# Patient Record
Sex: Female | Born: 1950 | Race: White | Hispanic: No | Marital: Married | Smoking: Never smoker
Health system: Southern US, Community
[De-identification: ages and names within clinical notes are randomized; demographics above are authoritative.]

## PROBLEM LIST (undated history)

## (undated) DIAGNOSIS — K219 Gastro-esophageal reflux disease without esophagitis: Secondary | ICD-10-CM

## (undated) DIAGNOSIS — K635 Polyp of colon: Secondary | ICD-10-CM

## (undated) DIAGNOSIS — E079 Disorder of thyroid, unspecified: Secondary | ICD-10-CM

## (undated) HISTORY — DX: Disorder of thyroid, unspecified: E07.9

## (undated) HISTORY — PX: THYROID SURGERY: SHX805

## (undated) HISTORY — DX: Polyp of colon: K63.5

## (undated) HISTORY — PX: WISDOM TOOTH EXTRACTION: SHX21

## (undated) HISTORY — DX: Gastro-esophageal reflux disease without esophagitis: K21.9

## (undated) HISTORY — PX: COLONOSCOPY: SHX5424

---

## 1998-01-04 ENCOUNTER — Encounter: Payer: Self-pay | Admitting: Emergency Medicine

## 1998-01-04 ENCOUNTER — Emergency Department (HOSPITAL_COMMUNITY): Admission: EM | Admit: 1998-01-04 | Discharge: 1998-01-04 | Payer: Self-pay | Admitting: Emergency Medicine

## 2003-11-18 ENCOUNTER — Ambulatory Visit (HOSPITAL_COMMUNITY): Admission: RE | Admit: 2003-11-18 | Discharge: 2003-11-18 | Payer: Self-pay | Admitting: Family Medicine

## 2005-07-24 ENCOUNTER — Ambulatory Visit: Payer: Self-pay | Admitting: Internal Medicine

## 2005-08-09 ENCOUNTER — Ambulatory Visit: Payer: Self-pay | Admitting: Internal Medicine

## 2010-04-26 ENCOUNTER — Other Ambulatory Visit: Payer: Self-pay | Admitting: Dermatology

## 2010-09-08 ENCOUNTER — Encounter: Payer: Self-pay | Admitting: Internal Medicine

## 2010-12-30 ENCOUNTER — Emergency Department (HOSPITAL_COMMUNITY): Payer: BC Managed Care – PPO

## 2010-12-30 ENCOUNTER — Emergency Department (HOSPITAL_COMMUNITY)
Admission: EM | Admit: 2010-12-30 | Discharge: 2010-12-30 | Disposition: A | Discharge status: Home / Self Care | Payer: BC Managed Care – PPO | Attending: Emergency Medicine | Admitting: Emergency Medicine

## 2010-12-30 ENCOUNTER — Ambulatory Visit (INDEPENDENT_AMBULATORY_CARE_PROVIDER_SITE_OTHER): Payer: BC Managed Care – PPO

## 2010-12-30 ENCOUNTER — Inpatient Hospital Stay (INDEPENDENT_AMBULATORY_CARE_PROVIDER_SITE_OTHER)
Admission: RE | Admit: 2010-12-30 | Discharge: 2010-12-30 | Disposition: A | Discharge status: Home / Self Care | Payer: BC Managed Care – PPO | Source: Ambulatory Visit | Attending: Emergency Medicine | Admitting: Emergency Medicine

## 2010-12-30 DIAGNOSIS — S20219A Contusion of unspecified front wall of thorax, initial encounter: Secondary | ICD-10-CM

## 2010-12-30 DIAGNOSIS — E039 Hypothyroidism, unspecified: Secondary | ICD-10-CM | POA: Insufficient documentation

## 2010-12-30 DIAGNOSIS — Y93E1 Activity, personal bathing and showering: Secondary | ICD-10-CM | POA: Insufficient documentation

## 2010-12-30 DIAGNOSIS — Y92009 Unspecified place in unspecified non-institutional (private) residence as the place of occurrence of the external cause: Secondary | ICD-10-CM | POA: Insufficient documentation

## 2010-12-30 DIAGNOSIS — Z79899 Other long term (current) drug therapy: Secondary | ICD-10-CM | POA: Insufficient documentation

## 2010-12-30 DIAGNOSIS — R071 Chest pain on breathing: Secondary | ICD-10-CM | POA: Insufficient documentation

## 2010-12-30 DIAGNOSIS — R1011 Right upper quadrant pain: Secondary | ICD-10-CM | POA: Insufficient documentation

## 2010-12-30 DIAGNOSIS — W010XXA Fall on same level from slipping, tripping and stumbling without subsequent striking against object, initial encounter: Secondary | ICD-10-CM | POA: Insufficient documentation

## 2010-12-30 LAB — POCT I-STAT, CHEM 8
Chloride: 106 mEq/L (ref 96–112)
HCT: 44 % (ref 36.0–46.0)
Potassium: 4.1 mEq/L (ref 3.5–5.1)
Sodium: 142 mEq/L (ref 135–145)

## 2010-12-30 LAB — POCT I-STAT TROPONIN I: Troponin i, poc: 0 ng/mL (ref 0.00–0.08)

## 2010-12-30 MED ORDER — IOHEXOL 300 MG/ML  SOLN
100.0000 mL | Freq: Once | INTRAMUSCULAR | Status: AC | PRN
  Administered 2010-12-30: 100 mL via INTRAVENOUS

## 2011-12-26 ENCOUNTER — Encounter: Payer: Self-pay | Admitting: Internal Medicine

## 2013-12-27 ENCOUNTER — Other Ambulatory Visit: Payer: Self-pay | Admitting: Family Medicine

## 2013-12-27 DIAGNOSIS — R142 Eructation: Secondary | ICD-10-CM

## 2013-12-27 DIAGNOSIS — K30 Functional dyspepsia: Secondary | ICD-10-CM

## 2013-12-27 DIAGNOSIS — R1011 Right upper quadrant pain: Secondary | ICD-10-CM

## 2014-01-04 ENCOUNTER — Other Ambulatory Visit: Payer: Self-pay | Admitting: Family Medicine

## 2014-01-04 ENCOUNTER — Ambulatory Visit
Admission: RE | Admit: 2014-01-04 | Discharge: 2014-01-04 | Disposition: A | Discharge status: Home / Self Care | Payer: 59 | Source: Ambulatory Visit | Attending: Family Medicine | Admitting: Family Medicine

## 2014-01-04 DIAGNOSIS — R142 Eructation: Secondary | ICD-10-CM

## 2014-01-04 DIAGNOSIS — K30 Functional dyspepsia: Secondary | ICD-10-CM

## 2014-01-04 DIAGNOSIS — R1011 Right upper quadrant pain: Secondary | ICD-10-CM

## 2014-09-15 ENCOUNTER — Encounter: Payer: Self-pay | Admitting: Internal Medicine

## 2014-11-28 ENCOUNTER — Ambulatory Visit (INDEPENDENT_AMBULATORY_CARE_PROVIDER_SITE_OTHER): Payer: BLUE CROSS/BLUE SHIELD | Admitting: Internal Medicine

## 2014-11-28 ENCOUNTER — Encounter: Payer: Self-pay | Admitting: Internal Medicine

## 2014-11-28 VITALS — BP 132/80 | HR 68 | Ht 65.75 in | Wt 184.1 lb

## 2014-11-28 DIAGNOSIS — R142 Eructation: Secondary | ICD-10-CM | POA: Diagnosis not present

## 2014-11-28 DIAGNOSIS — Z86010 Personal history of colonic polyps: Secondary | ICD-10-CM | POA: Diagnosis not present

## 2014-11-28 DIAGNOSIS — K219 Gastro-esophageal reflux disease without esophagitis: Secondary | ICD-10-CM

## 2014-11-28 DIAGNOSIS — R1314 Dysphagia, pharyngoesophageal phase: Secondary | ICD-10-CM

## 2014-11-28 NOTE — Progress Notes (Signed)
HISTORY OF PRESENT ILLNESS:  Kelsey Romero is a 64 y.o. female who is referred today by her primary care provider Dr. Aida Puffer regarding a chief complaint of chronic dyspepsia and the need for surveillance colonoscopy. The patient was last seen 08/09/2005 for screening colonoscopy. She was found to have a 5 mm sigmoid colon polyp which was removed and found to be adenomatous. Also mild sigmoid diverticulosis. Follow-up in 5 years recommended. She did receive a recall letter but did not act. The patient reports to me problems with burping or belching for the past 10-12 years. This is problematic about once every 6 weeks and occasionally associated with mid scapular discomfort. She will use PPI or acid on demand. This seems to help. She denies classic reflux symptoms such as pyrosis or water brash. She does notice rare solid food dysphagia item such as french fries. No lower GI complaints. Her weight has been stable. No family history of gastrointestinal malignancy.  REVIEW OF SYSTEMS:  All non-GI ROS negative upon comprehensive review of all systems  Past Medical History  Diagnosis Date  . Thyroid disease   . GERD (gastroesophageal reflux disease)     Past Surgical History  Procedure Laterality Date  . Colonoscopy    . Thyroid surgery      radiation  . Wisdom tooth extraction      Social History Kelsey Romero  reports that she has never smoked. She has never used smokeless tobacco. She reports that she drinks alcohol. She reports that she does not use illicit drugs.  family history includes Breast cancer in her mother.  No Known Allergies     PHYSICAL EXAMINATION: Vital signs: BP 132/80 mmHg  Pulse 68  Ht 5' 5.75" (1.67 m)  Wt 184 lb 2 oz (83.519 kg)  BMI 29.95 kg/m2  Constitutional: generally well-appearing, no acute distress Psychiatric: alert and oriented x3, cooperative Eyes: extraocular movements intact, anicteric, conjunctiva pink Mouth: oral pharynx moist, no  lesions Neck: supple no lymphadenopathy Cardiovascular: heart regular rate and rhythm, no murmur Lungs: clear to auscultation bilaterally Abdomen: soft, nontender, nondistended, no obvious ascites, no peritoneal signs, normal bowel sounds, no organomegaly Rectal: Deferred until colonoscopy Extremities: no clubbing cyanosis or lower extremity edema bilaterally Skin: no lesions on visible extremities Neuro: No focal deficits. No asterixis.  ASSESSMENT:  #1. Chronic belching. No alarm features #2. Mild intermittent dysphagia. Rule out stricture #3. History of adenomatous colon polyp May 2007. Overdue for follow-up  PLAN:  #1. Discussed increased intestinal gas and belching. Reassurance #2. Upper endoscopy to evaluate intermittent dysphagia and dyspeptic symptoms.The nature of the procedure, as well as the risks, benefits, and alternatives were carefully and thoroughly reviewed with the patient. Ample time for discussion and questions allowed. The patient understood, was satisfied, and agreed to proceed. #3. Surveillance colonoscopy.The nature of the procedure, as well as the risks, benefits, and alternatives were carefully and thoroughly reviewed with the patient. Ample time for discussion and questions allowed. The patient understood, was satisfied, and agreed to proceed.  A copy of this consultation note has been sent to Dr. Aida Puffer

## 2014-11-28 NOTE — Patient Instructions (Signed)

## 2014-11-29 ENCOUNTER — Encounter: Payer: Self-pay | Admitting: Internal Medicine

## 2015-02-01 ENCOUNTER — Ambulatory Visit (AMBULATORY_SURGERY_CENTER): Payer: BLUE CROSS/BLUE SHIELD | Admitting: Internal Medicine

## 2015-02-01 ENCOUNTER — Encounter: Payer: Self-pay | Admitting: Internal Medicine

## 2015-02-01 VITALS — BP 118/50 | HR 66 | Temp 97.2°F | Resp 20 | Ht 65.0 in | Wt 184.0 lb

## 2015-02-01 DIAGNOSIS — K2100 Gastro-esophageal reflux disease with esophagitis, without bleeding: Secondary | ICD-10-CM

## 2015-02-01 DIAGNOSIS — Z86010 Personal history of colonic polyps: Secondary | ICD-10-CM | POA: Diagnosis not present

## 2015-02-01 DIAGNOSIS — R1319 Other dysphagia: Secondary | ICD-10-CM

## 2015-02-01 DIAGNOSIS — R1314 Dysphagia, pharyngoesophageal phase: Secondary | ICD-10-CM | POA: Diagnosis present

## 2015-02-01 DIAGNOSIS — R1310 Dysphagia, unspecified: Secondary | ICD-10-CM

## 2015-02-01 DIAGNOSIS — K219 Gastro-esophageal reflux disease without esophagitis: Secondary | ICD-10-CM

## 2015-02-01 DIAGNOSIS — K208 Other esophagitis: Secondary | ICD-10-CM | POA: Diagnosis not present

## 2015-02-01 MED ORDER — SODIUM CHLORIDE 0.9 % IV SOLN: 500.0000 mL | INTRAVENOUS | Status: DC

## 2015-02-01 MED ORDER — OMEPRAZOLE 40 MG PO CPDR: 40.0000 mg | DELAYED_RELEASE_CAPSULE | Freq: Every day | ORAL | Status: DC

## 2015-02-01 NOTE — Progress Notes (Signed)
Called to room to assist during endoscopic procedure.  Patient ID and intended procedure confirmed with present staff. Received instructions for my participation in the procedure from the performing physician.  

## 2015-02-01 NOTE — Op Note (Signed)
[facility] [address] [place], [id number]   COLONOSCOPY PROCEDURE REPORT  PATIENT: Kelsey Romero, Kelsey Romero  MR#: [id number] BIRTHDATE: March 26, 1951 , 64  yrs. old GENDER: female ENDOSCOPIST: Roxy CedarJohn N Perry Jr, MD REFERRED XB:JYNWGBY:James Little, M.D. PROCEDURE DATE:  02/01/2015 PROCEDURE:   Colonoscopy, surveillance First Screening Colonoscopy - Avg.  risk and is 50 yrs.  old or older - No.  Prior Negative Screening - Now for repeat screening. N/A  History of Adenoma - Now for follow-up colonoscopy & has been > or = to 3 yrs.  Yes hx of adenoma.  Has been 3 or more years since last colonoscopy.  Polyps removed today? No Recommend repeat exam, <10 yrs? No ASA CLASS:   Class II INDICATIONS:Surveillance due to prior colonic neoplasia and PH Colon Adenoma. index examination 2007 with small tubular adenoma MEDICATIONS: Monitored anesthesia care and Propofol 300 mg IV  DESCRIPTION OF PROCEDURE:   After the risks benefits and alternatives of the procedure were thoroughly explained, informed consent was obtained.  The digital rectal exam revealed no abnormalities of the rectum.   The LB [id number]  endoscope was introduced through the anus and advanced to the cecum, which was identified by both the appendix and ileocecal valve. No adverse events experienced.   The quality of the prep was excellent. (Suprep was used)  The instrument was then slowly withdrawn as the colon was fully examined. Estimated blood loss is zero unless otherwise noted in this procedure report.     COLON FINDINGS: A normal appearing cecum, ileocecal valve, and appendiceal orifice were identified.  The ascending, transverse, descending, sigmoid colon, and rectum appeared unremarkable. Retroflexed views revealed no abnormalities. The time to cecum = 3.6 Withdrawal time = 8.5   The scope was withdrawn and the procedure completed. COMPLICATIONS: There were no immediate complications.  ENDOSCOPIC  IMPRESSION: 1. Normal colonoscopy  RECOMMENDATIONS: 1. Continue current colorectal screening recommendations for "routine risk" patients with a repeat colonoscopy in 10 years. 2. EGD today (please see report)  eSigned:  Roxy CedarJohn N Perry Jr, MD 02/01/2015 8:59 AM   cc: The Patient and Aida PufferJames Little, MD

## 2015-02-01 NOTE — Op Note (Signed)
[facility] [address] [place], [id number]   ENDOSCOPY PROCEDURE REPORT  PATIENT: Kelsey RoesFields, [name]  MR#: [id number] BIRTHDATE: 10/10/1950 , 64  yrs. old GENDER: female ENDOSCOPIST: Roxy CedarJohn N Michalle Rademaker Jr, MD REFERRED BY:  Aida PufferJames Little, M.D. PROCEDURE DATE:  02/01/2015 PROCEDURE:  EGD w/ biopsy ASA CLASS:     Class II INDICATIONS:  history of esophageal reflux and dysphagia. MEDICATIONS: Monitored anesthesia care and Propofol 150 mg IV TOPICAL ANESTHETIC: none  DESCRIPTION OF PROCEDURE: After the risks benefits and alternatives of the procedure were thoroughly explained, informed consent was obtained.  The LB [id number] endoscope was introduced through the mouth and advanced to the second portion of the duodenum , Without limitations.  The instrument was slowly withdrawn as the mucosa was fully examined.  The distal esophagus revealed esophagitis as manifested by edema and mucosal Z line.  There was a 5 mm circular area of slightly raised columnar mucosa just above the Z line.  This was biopsied.  The stomach and duodenum were normal.  Attempts to pass a 54 [place] dilator were unsuccessful (pharyngeal anatomy). Retroflexed views revealed a hiatal hernia.     The scope was then withdrawn from the patient and the procedure completed.  COMPLICATIONS: There were no immediate complications.  ENDOSCOPIC IMPRESSION: 1. GERD with endoscopic evidence of esophagitis 2. Nodular area of columnar mucosa as described. Biopsied 3. Unable to pass dilator beyond posterior pharynx (anatomy)  RECOMMENDATIONS: 1.  Anti-reflux regimen to be followed 2.  Prescribe omeprazole 40 mg daily; #30; 11 refills 3. Please contact the office  in the next few days to make a follow-up appointment with Dr. Marina GoodellPerry in about 8-10 weeks  REPEAT EXAM:  eSigned:  Roxy CedarJohn N Kamika Goodloe Jr, MD 02/01/2015 9:06 AM    CC:The Patient and Catha GosselinKevin Little, MD

## 2015-02-01 NOTE — Progress Notes (Signed)
Transferred to recovery room. A/O x3, pleased with MAC.  VSS.  Report to wendy, RN. 

## 2015-02-01 NOTE — Patient Instructions (Addendum)

## 2015-02-02 ENCOUNTER — Telehealth: Payer: Self-pay | Admitting: *Deleted

## 2015-02-02 NOTE — Telephone Encounter (Signed)
  Follow up Call-  Call back number 02/01/2015  Post procedure Call Back phone  # [phone]  Permission to leave phone message Yes     Patient questions:  Do you have a fever, pain , or abdominal swelling? No. Pain Score  0 *  Have you tolerated food without any problems? Yes.    Have you been able to return to your normal activities? Yes.    Do you have any questions about your discharge instructions: Diet   No. Medications  No. Follow up visit  No.  Do you have questions or concerns about your Care? No.  Actions: * If pain score is 4 or above: No action needed, pain <4. Pt mentioned that she had a bruise on top of left hand when got home and questioned what that would have been from. Searched documentation and unable to find anything about this. No documentation found and there was only one iv stick and that was in rt arm the patient. Pt said it was just a bruise ,did not look like any needle mark left hand. Pt also said she had what looked like a fever blister inside top lip but said she had never had a fever blister and asked if i knew what this could be from . Explained bite block used could have pinched her lip . Encouraged pt to call back if either of these places did not get better or got worse.

## 2015-02-04 ENCOUNTER — Encounter: Payer: Self-pay | Admitting: Internal Medicine

## 2015-02-21 ENCOUNTER — Telehealth: Payer: Self-pay | Admitting: Internal Medicine

## 2015-02-21 NOTE — Telephone Encounter (Signed)
Pt had ECL on 02/01/15. States prior to EGD she would have episodes here and there where she would feel something like a lump in her chest and lots of belching and burping. Pt states that since the EGD she has been having this sensation every day. Pt states she is taking omeprazole 40mg  daily. Please advise.

## 2015-02-21 NOTE — Telephone Encounter (Signed)
She's actually had some form of the symptoms for years. Endoscopy did not reveal any significant abnormalities. Recommend continuing her medication and following up in 8-10 weeks as previously recommended.

## 2015-02-21 NOTE — Telephone Encounter (Signed)
Spoke with pt and she is aware.

## 2015-03-30 ENCOUNTER — Encounter: Payer: Self-pay | Admitting: Internal Medicine

## 2015-03-30 ENCOUNTER — Ambulatory Visit (INDEPENDENT_AMBULATORY_CARE_PROVIDER_SITE_OTHER): Payer: BLUE CROSS/BLUE SHIELD | Admitting: Internal Medicine

## 2015-03-30 VITALS — BP 124/88 | HR 68 | Ht 65.75 in | Wt 168.0 lb

## 2015-03-30 DIAGNOSIS — R1314 Dysphagia, pharyngoesophageal phase: Secondary | ICD-10-CM | POA: Diagnosis not present

## 2015-03-30 DIAGNOSIS — R142 Eructation: Secondary | ICD-10-CM

## 2015-03-30 DIAGNOSIS — K2100 Gastro-esophageal reflux disease with esophagitis, without bleeding: Secondary | ICD-10-CM

## 2015-03-30 DIAGNOSIS — Z86010 Personal history of colonic polyps: Secondary | ICD-10-CM

## 2015-03-30 DIAGNOSIS — R1310 Dysphagia, unspecified: Secondary | ICD-10-CM

## 2015-03-30 DIAGNOSIS — R1319 Other dysphagia: Secondary | ICD-10-CM

## 2015-03-30 NOTE — Patient Instructions (Signed)
Please follow up with Dr. Marina GoodellPerry in 1 year.

## 2015-03-30 NOTE — Progress Notes (Signed)
HISTORY OF PRESENT ILLNESS:  Kelsey Romero is a 64 y.o. female  who was evaluated 11/28/2014 regarding reflux disease with intermittent dysphagia, chronic belching, and surveillance colonoscopy. See that dictation for details. Chronic belching and increased intestinal gas was discussed in detail. Reassurance provided. She subsequently underwent surveillance colonoscopy which was normal. Follow-up in 10 years recommended. Upper endoscopy revealed endoscopic evidence of esophagitis as well as a nodular area of columnar mucosa which was biopsied and revealed benign squamocolumnar mucosa with chronic inflammation. No intestinal metaplasia or dysplasia. She was prescribed omeprazole 40 mg daily. She has been compliant with medication and presents today for follow-up. The patient does state post procedure she had problems with worsening belching and gas as well as associated mid chest discomfort. Those symptoms have subsequently improved. She does report definitive improvement in classic reflux symptoms. As, her dysphagia has resolved. No new complaints except for the cost of her colonoscopy.  REVIEW OF SYSTEMS:  All non-GI ROS negative upon comprehensive review  Past Medical History  Diagnosis Date  . Thyroid disease   . GERD (gastroesophageal reflux disease)   . Colon polyp     adenomatous    Past Surgical History  Procedure Laterality Date  . Colonoscopy    . Thyroid surgery      radiation  . Wisdom tooth extraction      Social History Kelsey Romero  reports that she has never smoked. She has never used smokeless tobacco. She reports that she drinks alcohol. She reports that she does not use illicit drugs.  family history includes Breast cancer in her mother.  No Known Allergies     PHYSICAL EXAMINATION: Vital signs: BP 124/88 mmHg  Pulse 68  Ht 5' 5.75" (1.67 m)  Wt 168 lb (76.204 kg)  BMI 27.32 kg/m2 General: Well-developed, well-nourished, no acute distress HEENT:  Sclerae are anicteric, conjunctiva pink. Oral mucosa intact Lungs: Clear Heart: Regular Abdomen: soft, nontender, nondistended, no obvious ascites, no peritoneal signs, normal bowel sounds. No organomegaly. Extremities: No clubbing cyanosis or edema Psychiatric: alert and oriented x3. Cooperative   ASSESSMENT:  #1. GERD with endoscopic evidence of esophagitis. Improved on PPI #2. Dysphagia. No dilation performed due to posterior pharyngeal anatomy. Fortunately, symptoms improved on PPI likely due to improvement in inflammation and edema #3. Chronic belching. Functional #4. History of adenomatous colon polyps. Most recent colonoscopy normal.   PLAN:  #1. Reflux precautions #2. Continue omeprazole 40 mg daily #3. Reassurance with regards to belching #4. Surveillance colonoscopy in 10 years #5. Routine GI follow-up one year. Resume care with PCP  15 minutes was spent face-to-face with the patient. Greater than 50% of the time used for counseling regarding chronic GERD, belching, and appropriate intervals for colonoscopy

## 2016-02-16 ENCOUNTER — Other Ambulatory Visit: Payer: Self-pay | Admitting: Internal Medicine

## 2016-02-16 DIAGNOSIS — R1310 Dysphagia, unspecified: Secondary | ICD-10-CM

## 2016-02-16 DIAGNOSIS — R1319 Other dysphagia: Secondary | ICD-10-CM

## 2016-03-07 IMAGING — US US ABDOMEN LIMITED
1 series · 14 of 25 positions shown · non-contrast
Comparison: CT abdomen pelvis 12/30/2010

CLINICAL DATA: Right upper quadrant pain

EXAM:
US ABDOMEN LIMITED - RIGHT UPPER QUADRANT

[Series 1: us abdomen limited · 0.22mm/px · 14 of 45 slices shown]
[im 1/45]
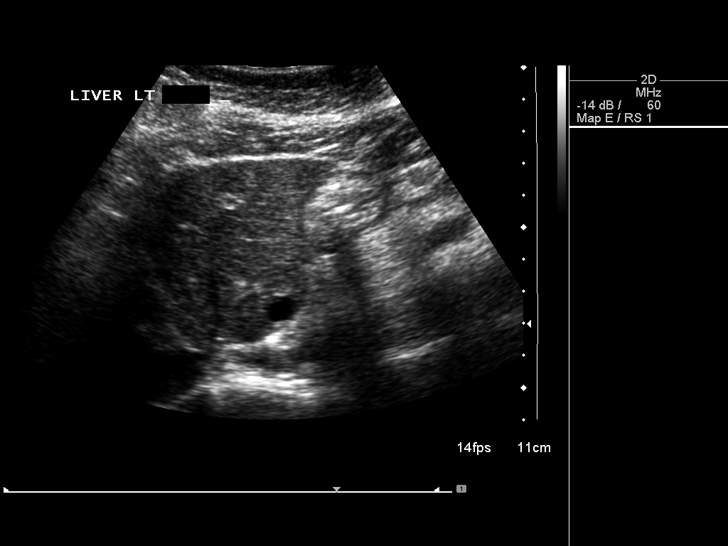
[im 4/45]
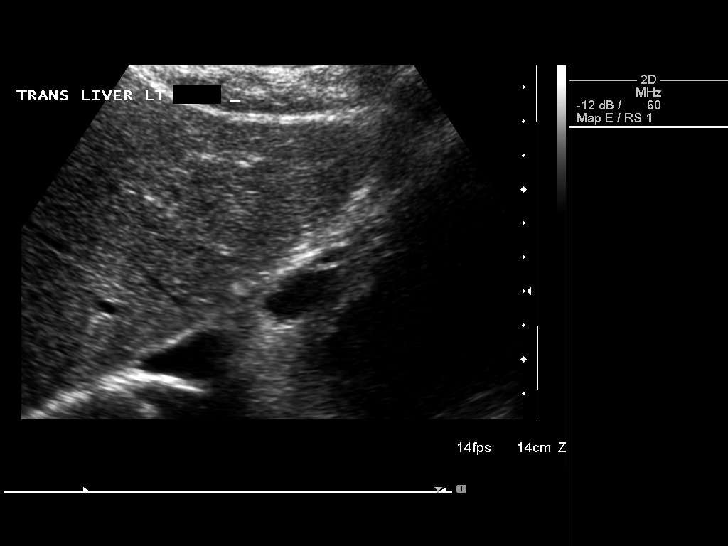
[im 8/45]
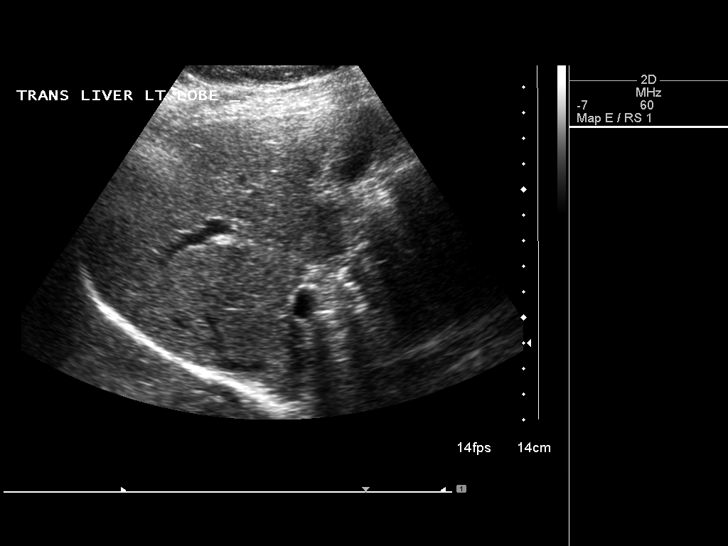
[im 12/45]
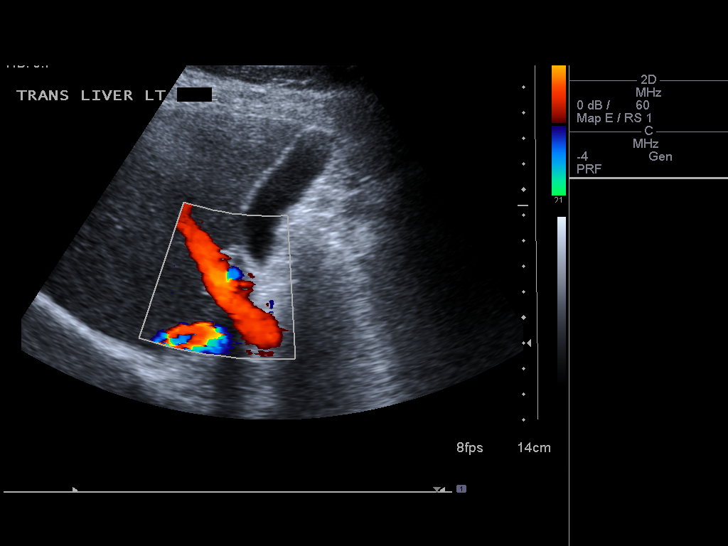
[im 15/45]
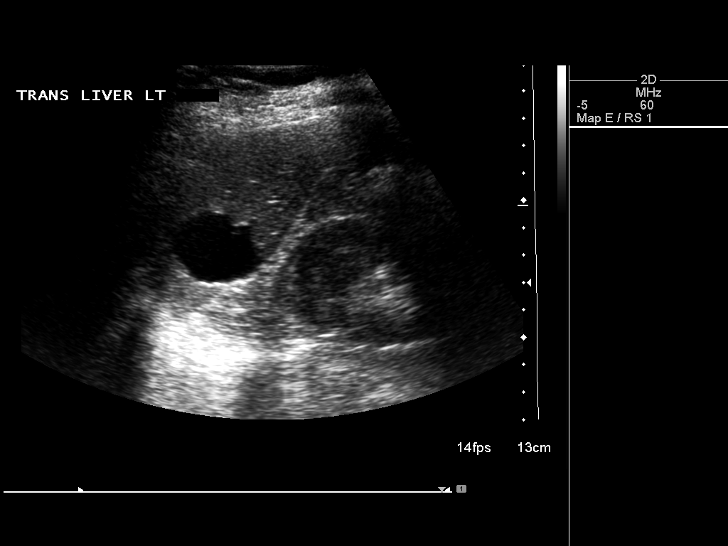
[im 17/45]
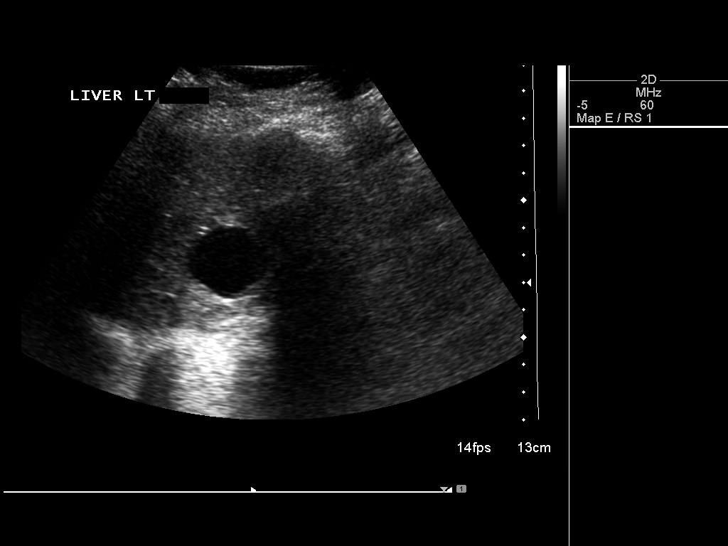
[im 21/45]
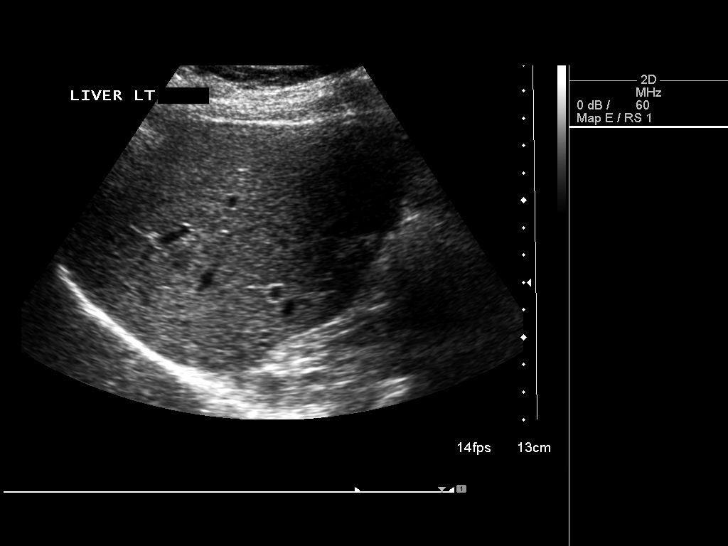
[im 24/45]
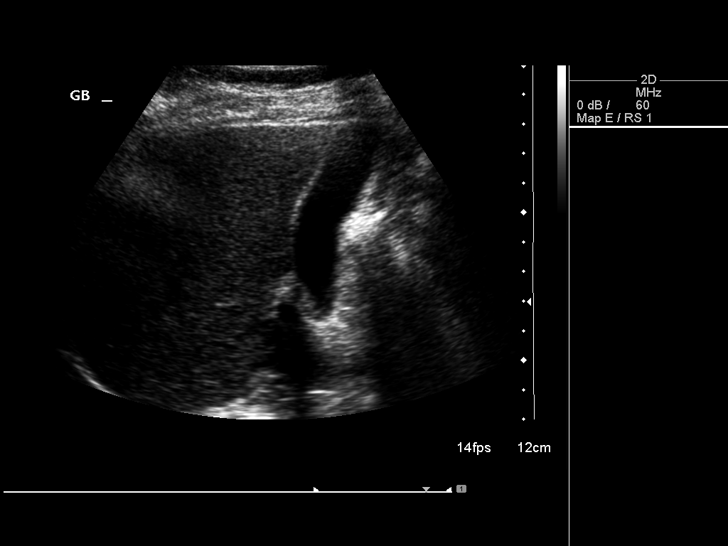
[im 28/45]
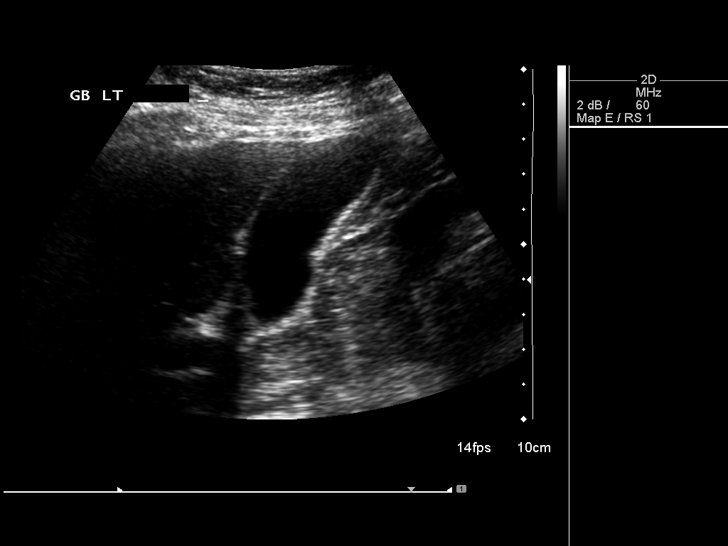
[im 30/45]
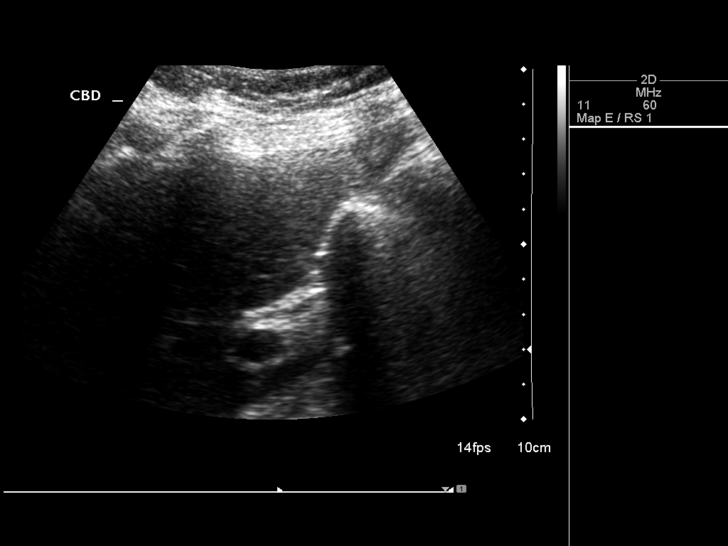
[im 34/45]
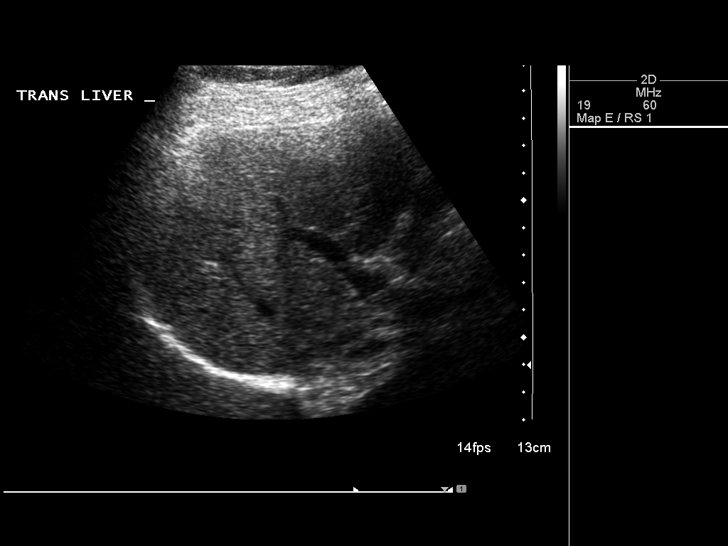
[im 37/45]
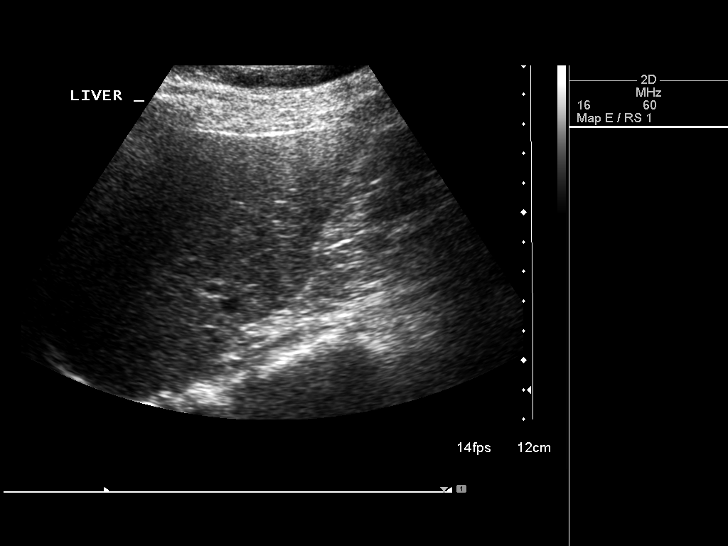
[im 41/45]
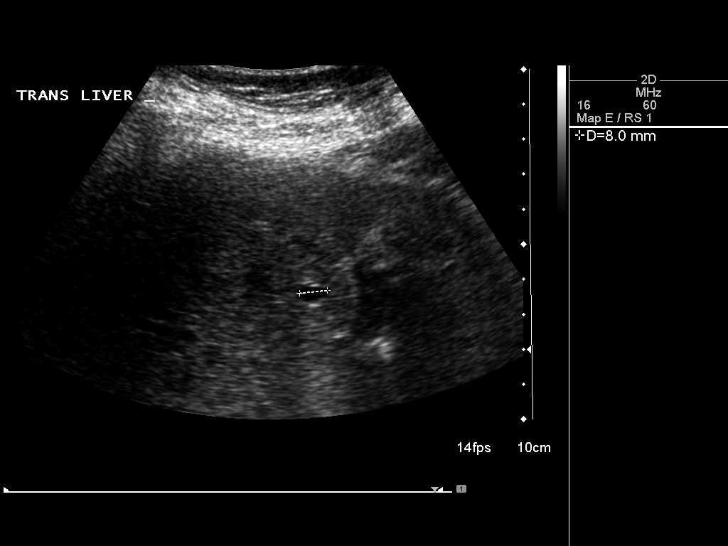
[im 45/45]
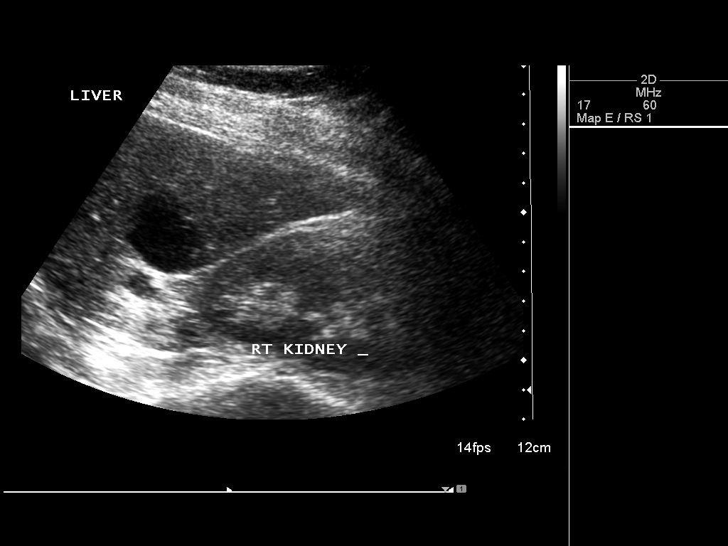

[14 of 25 positions shown; findings below may reference images not displayed]

FINDINGS: Gallbladder:

No gallstones or wall thickening visualized. No sonographic Murphy
sign noted.

Common bile duct:

Diameter: 3.1 mm

Liver:

Several liver cysts. Largest cyst 2.9 x 3.2 cm in the posterior
right lobe.
IMPRESSION: Negative for gallstones.  Hepatic cysts.

## 2016-06-17 DIAGNOSIS — E039 Hypothyroidism, unspecified: Secondary | ICD-10-CM | POA: Insufficient documentation

## 2016-06-17 DIAGNOSIS — E890 Postprocedural hypothyroidism: Secondary | ICD-10-CM | POA: Insufficient documentation

## 2016-06-17 DIAGNOSIS — R143 Flatulence: Secondary | ICD-10-CM | POA: Insufficient documentation

## 2017-09-11 DIAGNOSIS — G5602 Carpal tunnel syndrome, left upper limb: Secondary | ICD-10-CM | POA: Insufficient documentation

## 2019-04-27 ENCOUNTER — Ambulatory Visit: Payer: BLUE CROSS/BLUE SHIELD

## 2019-05-06 ENCOUNTER — Ambulatory Visit: Payer: BLUE CROSS/BLUE SHIELD | Attending: Internal Medicine

## 2019-05-06 DIAGNOSIS — Z23 Encounter for immunization: Secondary | ICD-10-CM | POA: Insufficient documentation

## 2019-05-06 NOTE — Progress Notes (Signed)
   Covid-19 Vaccination Clinic  Name:  Kelsey Romero    MRN: [id number] DOB: Apr 02, 1950  05/06/2019  [name] was observed post Covid-19 immunization for 15 minutes without incidence. She was provided with Vaccine Information Sheet and instruction to access the V-Safe system.   Ms. Way was instructed to call 911 with any severe reactions post vaccine: Marland Kitchen Difficulty breathing  . Swelling of your face and throat  . A fast heartbeat  . A bad rash all over your body  . Dizziness and weakness    Immunizations Administered    Name Date Dose VIS Date Route   Pfizer COVID-19 Vaccine 05/06/2019  9:57 AM 0.3 mL 03/12/2019 Intramuscular   Manufacturer: [employer], [employer]   Lot: [id number]   NDC: [address]-5

## 2019-05-31 ENCOUNTER — Ambulatory Visit: Payer: BLUE CROSS/BLUE SHIELD | Attending: Internal Medicine

## 2019-05-31 DIAGNOSIS — Z23 Encounter for immunization: Secondary | ICD-10-CM | POA: Insufficient documentation

## 2019-05-31 NOTE — Progress Notes (Signed)
   Covid-19 Vaccination Clinic  Name:  SHAQUANNA LYCAN    MRN: [id number] DOB: 1950-08-11  05/31/2019  Ms. Ferdinand was observed post Covid-19 immunization for 15 minutes without incidence. She was provided with Vaccine Information Sheet and instruction to access the V-Safe system.   Ms. Mainwaring was instructed to call 911 with any severe reactions post vaccine: Marland Kitchen Difficulty breathing  . Swelling of your face and throat  . A fast heartbeat  . A bad rash all over your body  . Dizziness and weakness    Immunizations Administered    Name Date Dose VIS Date Route   Pfizer COVID-19 Vaccine 05/31/2019  2:19 PM 0.3 mL 03/12/2019 Intramuscular   Manufacturer: [employer], [employer]   Lot: [id number]   NDC: [address]-4

## 2021-07-17 ENCOUNTER — Ambulatory Visit (INDEPENDENT_AMBULATORY_CARE_PROVIDER_SITE_OTHER): Payer: Medicare HMO | Admitting: Physician Assistant

## 2021-07-17 ENCOUNTER — Encounter: Payer: Self-pay | Admitting: Physician Assistant

## 2021-07-17 VITALS — BP 130/83 | HR 68 | Temp 97.8°F | Ht 68.0 in | Wt 168.0 lb

## 2021-07-17 DIAGNOSIS — Z7689 Persons encountering health services in other specified circumstances: Secondary | ICD-10-CM | POA: Diagnosis not present

## 2021-07-17 DIAGNOSIS — E890 Postprocedural hypothyroidism: Secondary | ICD-10-CM | POA: Diagnosis not present

## 2021-07-17 DIAGNOSIS — K219 Gastro-esophageal reflux disease without esophagitis: Secondary | ICD-10-CM

## 2021-07-17 NOTE — Patient Instructions (Signed)

## 2021-07-17 NOTE — Progress Notes (Signed)
? ?New Patient Office Visit ? ?Subjective   ? ?Patient ID: Kelsey Romero, female    DOB: 1951-02-16  Age: 71 y.o. MRN: [id number] ? ?CC:  ?Chief Complaint  ?Patient presents with  ? New Patient (Initial Visit)  ? ? ?HPI ?[name] presents to establish care. Patient has a past medical hx of hypothyroidism and GERD. Patient is s/p radiation therapy for hyperthyroidism. States was on levothyroxine 112 mcg and dose was adjusted to 125 mcg in 2021, has not had labs since then. Denies palpitations, fatigue, or weight changes. Takes omeprazole 20 mg every other day. Takes Vit D3 2000 units daily. Patient has never been a smoker, no alcohol use and drinks about 3 cups of caffeine daily. Stays active with walking, working in the yard and gardening.  ? ? ? ?Outpatient Encounter Medications as of 07/17/2021  ?Medication Sig  ? omeprazole (PRILOSEC) 20 MG capsule   ? cholecalciferol (VITAMIN D) 1000 UNITS tablet Take 1,000 Units by mouth daily.  ? levothyroxine (SYNTHROID) 125 MCG tablet Take 125 mcg by mouth every morning.  ? [DISCONTINUED] dexlansoprazole (DEXILANT) 60 MG capsule Take 60 mg by mouth as needed.   ? [DISCONTINUED] levothyroxine (SYNTHROID, LEVOTHROID) 112 MCG tablet Take 112 mcg by mouth daily before breakfast.  ? [DISCONTINUED] omeprazole (PRILOSEC) 40 MG capsule TAKE ONE CAPSULE BY MOUTH ONCE DAILY  ? ?No facility-administered encounter medications on file as of 07/17/2021.  ? ? ?Past Medical History:  ?Diagnosis Date  ? Colon polyp   ? adenomatous  ? GERD (gastroesophageal reflux disease)   ? Thyroid disease   ? ? ?Past Surgical History:  ?Procedure Laterality Date  ? COLONOSCOPY    ? THYROID SURGERY    ? radiation  ? WISDOM TOOTH EXTRACTION    ? ? ?Family History  ?Problem Relation Age of Onset  ? Breast cancer Mother   ? Heart disease Father   ? ? ?Social History  ? ?Socioeconomic History  ? Marital status: Married  ?  Spouse name: Tanysha Quant  ? Number of children: 0  ? Years of education: Not  on file  ? Highest education level: Not on file  ?Occupational History  ? Occupation: Retired  ?Tobacco Use  ? Smoking status: Never  ? Smokeless tobacco: Never  ?Substance and Sexual Activity  ? Alcohol use: Not Currently  ? Drug use: No  ? Sexual activity: Not Currently  ?  Birth control/protection: None  ?Other Topics Concern  ? Not on file  ?Social History Narrative  ? Not on file  ? ?Social Determinants of Health  ? ?Financial Resource Strain: Not on file  ?Food Insecurity: Not on file  ?Transportation Needs: Not on file  ?Physical Activity: Not on file  ?Stress: Not on file  ?Social Connections: Not on file  ?Intimate Partner Violence: Not on file  ? ? ?ROS ?Review of Systems:  ?A fourteen system review of systems was performed and found to be positive as per HPI. ?  ? ? ?Objective   ? ?BP 130/83   Pulse 68   Temp 97.8 ?F (36.6 ?C)   Ht 5\' 8"  (1.727 m)   Wt 168 lb (76.2 kg)   SpO2 98%   BMI 25.54 kg/m?  ? ?Physical Exam ?General:  Well Developed, well nourished, appropriate for stated age.  ?Neuro:  Alert and oriented,  extra-ocular muscles intact  ?HEENT:  Normocephalic, atraumatic, neck supple  ?Skin:  no gross rash, warm, pink. ?Cardiac:  RRR, S1  S2 ?Respiratory: CTA B/L  ?Vascular:  Ext warm, no cyanosis apprec.; cap RF less 2 sec. ?Psych:  No HI/SI, judgement and insight good, Euthymic mood. Full Affect. ? ?Last CBC ?Lab Results  ?Component Value Date  ? HGB 15.0 12/30/2010  ? HCT 44.0 12/30/2010  ? ?Last metabolic panel ?Lab Results  ?Component Value Date  ? GLUCOSE 104 (H) 12/30/2010  ? NA 142 12/30/2010  ? K 4.1 12/30/2010  ? CL 106 12/30/2010  ? BUN 17 12/30/2010  ? CREATININE 1.10 12/30/2010  ? ?Last lipids ?No results found for: CHOL, HDL, LDLCALC, LDLDIRECT, TRIG, CHOLHDL ?Last hemoglobin A1c ?No results found for: HGBA1C ?Last thyroid functions ?No results found for: TSH, T3TOTAL, T4TOTAL, THYROIDAB ?Last vitamin D ?No results found for: 25OHVITD2, 25OHVITD3, VD25OH ?  ?  ? ?Assessment &  Plan:  ? ?Problem List Items Addressed This Visit   ? ?  ? Endocrine  ? Hypothyroid - Primary  ? Relevant Medications  ? levothyroxine (SYNTHROID) 125 MCG tablet  ? Other Relevant Orders  ? TSH  ? T3  ? T4, free  ? ?Other Visit Diagnoses   ? ? Encounter to establish care      ? Gastroesophageal reflux disease, unspecified whether esophagitis present      ? Relevant Medications  ? omeprazole (PRILOSEC) 20 MG capsule  ? ?  ? ?Postoperative hypothyroidism: ?-Will obtain thyroid function tests today. Pending results will send additional refills of levothyroxine 125 mcg or adjust dose if indicated. ? ?GERD: ?-Continue omeprazole 20 mg every other day. Discussed potential provocative foods to avoid. Will continue to monitor. ? ?Return in about 3 months (around 10/16/2021) for MCW and FBW.  ? ?Mayer Masker, PA-C ? ? ?

## 2021-07-18 ENCOUNTER — Other Ambulatory Visit: Payer: Self-pay | Admitting: Physician Assistant

## 2021-07-18 DIAGNOSIS — E890 Postprocedural hypothyroidism: Secondary | ICD-10-CM

## 2021-07-18 LAB — TSH: TSH: 0.658 u[IU]/mL (ref 0.450–4.500)

## 2021-07-18 LAB — T3: T3, Total: 95 ng/dL (ref 71–180)

## 2021-07-18 LAB — T4, FREE: Free T4: 1.59 ng/dL (ref 0.82–1.77)

## 2021-07-18 MED ORDER — LEVOTHYROXINE SODIUM 125 MCG PO TABS: 125.0000 ug | ORAL_TABLET | Freq: Every morning | ORAL | 1 refills | Status: DC

## 2021-07-23 ENCOUNTER — Telehealth: Payer: Self-pay | Admitting: Physician Assistant

## 2021-07-23 DIAGNOSIS — E890 Postprocedural hypothyroidism: Secondary | ICD-10-CM

## 2021-07-23 MED ORDER — LEVOTHYROXINE SODIUM 125 MCG PO TABS: 125.0000 ug | ORAL_TABLET | Freq: Every morning | ORAL | 1 refills | Status: DC

## 2021-07-23 NOTE — Telephone Encounter (Signed)
Done

## 2021-07-23 NOTE — Telephone Encounter (Signed)
Can you please send over the synthroid to [facility] on [address]-she does not use [facility] any longer.  ?

## 2021-10-16 ENCOUNTER — Encounter: Payer: Self-pay | Admitting: Physician Assistant

## 2021-10-16 ENCOUNTER — Ambulatory Visit (INDEPENDENT_AMBULATORY_CARE_PROVIDER_SITE_OTHER): Payer: Medicare HMO | Admitting: Physician Assistant

## 2021-10-16 VITALS — Ht 68.0 in | Wt 168.0 lb

## 2021-10-16 DIAGNOSIS — Z0000 Encounter for general adult medical examination without abnormal findings: Secondary | ICD-10-CM

## 2021-10-16 DIAGNOSIS — E890 Postprocedural hypothyroidism: Secondary | ICD-10-CM

## 2021-10-16 NOTE — Progress Notes (Signed)
Subjective:   Kelsey Romero is a 71 y.o. female who presents for Medicare Annual (Subsequent) preventive examination.  Review of Systems    Refer to PCP  I connected with  ATZIRY BARANSKI on 10/16/21 by an audio only telemedicine application and verified that I am speaking with the correct person using two identifiers.   I discussed the limitations, risks, security and privacy concerns of performing an evaluation and management service by telephone and the availability of in person appointments. I also discussed with the patient that there may be a patient responsible charge related to this service. The patient expressed understanding and verbally consented to this telephonic visit.  Location of Patient: Home Location of Provider: Office  List any persons and their role that are participating in the visit with the patient.  Tonni Mansour, CMA     Objective:    Today's Vitals   10/16/21 0827  Weight: 168 lb (76.2 kg)  Height: 5\' 8"  (1.727 m)   Body mass index is 25.54 kg/m.      No data to display           Current Medications (verified) Outpatient Encounter Medications as of 10/16/2021  Medication Sig   cholecalciferol (VITAMIN D) 1000 UNITS tablet Take 1,000 Units by mouth daily.   levothyroxine (SYNTHROID) 125 MCG tablet Take 1 tablet (125 mcg total) by mouth every morning.   omeprazole (PRILOSEC) 20 MG capsule    No facility-administered encounter medications on file as of 10/16/2021.    Allergies (verified) Patient has no known allergies.   History: Past Medical History:  Diagnosis Date   Colon polyp    adenomatous   GERD (gastroesophageal reflux disease)    Thyroid disease    Past Surgical History:  Procedure Laterality Date   COLONOSCOPY     THYROID SURGERY     radiation   WISDOM TOOTH EXTRACTION     Family History  Problem Relation Age of Onset   Breast cancer Mother    Heart disease Father    Social History   Socioeconomic History    Marital status: Married    Spouse name: R. Aliyana Dlugosz   Number of children: 0   Years of education: Not on file   Highest education level: Not on file  Occupational History   Occupation: Retired  Tobacco Use   Smoking status: Never   Smokeless tobacco: Never  Vaping Use   Vaping Use: Never used  Substance and Sexual Activity   Alcohol use: Not Currently   Drug use: No   Sexual activity: Not Currently    Birth control/protection: None  Other Topics Concern   Not on file  Social History Narrative   Not on file   Social Determinants of Health   Financial Resource Strain: Low Risk  (10/16/2021)   Overall Financial Resource Strain (CARDIA)    Difficulty of Paying Living Expenses: Not hard at all  Food Insecurity: No Food Insecurity (10/16/2021)   Hunger Vital Sign    Worried About Running Out of Food in the Last Year: Never true    Ran Out of Food in the Last Year: Never true  Transportation Needs: No Transportation Needs (10/16/2021)   PRAPARE - 10/18/2021 (Medical): No    Lack of Transportation (Non-Medical): No  Physical Activity: Sufficiently Active (10/16/2021)   Exercise Vital Sign    Days of Exercise per Week: 7 days    Minutes of Exercise per Session:  50 min  Stress: No Stress Concern Present (10/16/2021)   [employer] of Occupational Health - Occupational Stress Questionnaire    Feeling of Stress : Not at all  Social Connections: Socially Integrated (10/16/2021)   Social Connection and Isolation Panel [NHANES]    Frequency of Communication with Friends and Family: More than three times a week    Frequency of Social Gatherings with Friends and Family: Once a week    Attends Religious Services: More than 4 times per year    Active Member of [employer] or Organizations: Yes    Attends Engineer, structural: More than 4 times per year    Marital Status: Married    Tobacco Counseling Counseling given: Not Answered   Clinical  Intake:                 Diabetic?No         Activities of Daily Living    10/16/2021    8:32 AM 07/17/2021    2:03 PM  In your present state of health, do you have any difficulty performing the following activities:  Hearing? 0 0  Vision? 0 0  Difficulty concentrating or making decisions? 1 0  Walking or climbing stairs? 0 0  Dressing or bathing? 0 0  Doing errands, shopping? 0 0    Patient Care Team: Mayer Masker, PA-C as PCP - General (Physician Assistant)  Indicate any recent Medical Services you may have received from other than [facility] providers in the past year (date may be approximate).     Assessment:   This is a routine wellness examination for Kelsey Romero.  Hearing/Vision screen No results found.  Dietary issues and exercise activities discussed:     Goals Addressed   None   Depression Screen    10/16/2021    8:31 AM 07/17/2021    2:03 PM  PHQ 2/9 Scores  PHQ - 2 Score 0 0  PHQ- 9 Score 0 0    Fall Risk    10/16/2021    8:31 AM 07/17/2021    2:03 PM  Fall Risk   Falls in the past year? 1 0  Number falls in past yr: 0 0  Injury with Fall? 1 0  Risk for fall due to : History of fall(s) No Fall Risks  Follow up Falls evaluation completed Falls evaluation completed    FALL RISK PREVENTION PERTAINING TO THE HOME:  Any stairs in or around the home? Yes  If so, are there any without handrails? Yes  Home free of loose throw rugs in walkways, pet beds, electrical cords, etc? Yes  Adequate lighting in your home to reduce risk of falls? Yes   ASSISTIVE DEVICES UTILIZED TO PREVENT FALLS:  Life alert? No  Use of a cane, walker or w/c? No  Grab bars in the bathroom? No  Shower chair or bench in shower? No  Elevated toilet seat or a handicapped toilet? No   TIMED UP AND GO:  Was the test performed? No .  Length of time to ambulate 10 feet: TELEHEALTH sec.     Cognitive Function:        10/16/2021    8:33 AM  6CIT Screen  What Year?  0 points  What month? 0 points  What time? 0 points  Count back from 20 0 points    Immunizations Immunization History  Administered Date(s) Administered   PFIZER(Purple Top)SARS-COV-2 Vaccination 05/06/2019, 05/31/2019    TDAP status: Due, Education has been provided regarding  the importance of this vaccine. Advised may receive this vaccine at local pharmacy or Health Dept. Aware to provide a copy of the vaccination record if obtained from local pharmacy or Health Dept. Verbalized acceptance and understanding.  Flu Vaccine status: Up to date  Pneumococcal vaccine status: Due, Education has been provided regarding the importance of this vaccine. Advised may receive this vaccine at local pharmacy or Health Dept. Aware to provide a copy of the vaccination record if obtained from local pharmacy or Health Dept. Verbalized acceptance and understanding.  Covid-19 vaccine status: Completed vaccines  Qualifies for Shingles Vaccine? Yes   Zostavax completed No   Shingrix Completed?: No.    Education has been provided regarding the importance of this vaccine. Patient has been advised to call insurance company to determine out of pocket expense if they have not yet received this vaccine. Advised may also receive vaccine at local pharmacy or Health Dept. Verbalized acceptance and understanding.  Screening Tests Health Maintenance  Topic Date Due   Hepatitis C Screening  Never done   TETANUS/TDAP  Never done   MAMMOGRAM  Never done   Zoster Vaccines- Shingrix (1 of 2) Never done   Pneumonia Vaccine 33+ Years old (1 - PCV) Never done   DEXA SCAN  Never done   COVID-19 Vaccine (3 - Pfizer series) 07/26/2019   INFLUENZA VACCINE  10/30/2021   COLONOSCOPY (Pts 45-14yrs Insurance coverage will need to be confirmed)  01/31/2025   HPV VACCINES  Aged Out    Health Maintenance  Health Maintenance Due  Topic Date Due   Hepatitis C Screening  Never done   TETANUS/TDAP  Never done   MAMMOGRAM   Never done   Zoster Vaccines- Shingrix (1 of 2) Never done   Pneumonia Vaccine 6+ Years old (1 - PCV) Never done   DEXA SCAN  Never done   COVID-19 Vaccine (3 - Pfizer series) 07/26/2019    Colorectal cancer screening: Type of screening: Colonoscopy. Completed 2016. Repeat every 10 years  Mammogram status: Completed DECEMBER 2022. Repeat every year  Dexa:Patient declined  Lung Cancer Screening: (Low Dose CT Chest recommended if Age 52-80 years, 30 pack-year currently smoking OR have quit w/in 15years.) does not qualify.   Lung Cancer Screening Referral:   Additional Screening:  Hepatitis C Screening: does not qualify; Patient declined  Vision Screening: Recommended annual ophthalmology exams for early detection of glaucoma and other disorders of the eye. Is the patient up to date with their annual eye exam?  Yes  Who is the provider or what is the name of the office in which the patient attends annual eye exams? "[facility]" in [place] If pt is not established with a provider, would they like to be referred to a provider to establish care? No .   Dental Screening: Recommended annual dental exams for proper oral hygiene  Community Resource Referral / Chronic Care Management: CRR required this visit?  No   CCM required this visit?  No      Plan:     I have personally reviewed and noted the following in the patient's chart:   Medical and social history Use of alcohol, tobacco or illicit drugs  Current medications and supplements including opioid prescriptions.  Functional ability and status Nutritional status Physical activity Advanced directives List of other physicians Hospitalizations, surgeries, and ER visits in previous 12 months Vitals Screenings to include cognitive, depression, and falls Referrals and appointments  In addition, I have reviewed and discussed  with patient certain preventive protocols, quality metrics, and best practice recommendations. A  written personalized care plan for preventive services as well as general preventive health recommendations were provided to patient.     Laren Everts Keyler Hoge, CMA   10/16/2021   Nurse Notes: Non Face to face 20 minutes   Ms. Sillas , Thank you for taking time to come for your Medicare Wellness Visit. I appreciate your ongoing commitment to your health goals. Please review the following plan we discussed and let me know if I can assist you in the future.   These are the goals we discussed:  Goals   None    Tdap Shingles  Pneumonia vaccines This is a list of the screening recommended for you and due dates:  Health Maintenance  Topic Date Due   Hepatitis C Screening: USPSTF Recommendation to screen - Ages 50-79 yo.  Never done   Tetanus Vaccine  Never done   Mammogram  Never done   Zoster (Shingles) Vaccine (1 of 2) Never done   Pneumonia Vaccine (1 - PCV) Never done   DEXA scan (bone density measurement)  Never done   COVID-19 Vaccine (3 - Pfizer series) 07/26/2019   Flu Shot  10/30/2021   Colon Cancer Screening  01/31/2025   HPV Vaccine  Aged Out

## 2021-10-16 NOTE — Patient Instructions (Signed)
Preventive Care 65 Years and Older, Female Preventive care refers to lifestyle choices and visits with your health care provider that can promote health and wellness. Preventive care visits are also called wellness exams. What can I expect for my preventive care visit? Counseling Your health care provider may ask you questions about your: Medical history, including: Past medical problems. Family medical history. Pregnancy and menstrual history. History of falls. Current health, including: Memory and ability to understand (cognition). Emotional well-being. Home life and relationship well-being. Sexual activity and sexual health. Lifestyle, including: Alcohol, nicotine or tobacco, and drug use. Access to firearms. Diet, exercise, and sleep habits. Work and work environment. Sunscreen use. Safety issues such as seatbelt and bike helmet use. Physical exam Your health care provider will check your: Height and weight. These may be used to calculate your BMI (body mass index). BMI is a measurement that tells if you are at a healthy weight. Waist circumference. This measures the distance around your waistline. This measurement also tells if you are at a healthy weight and may help predict your risk of certain diseases, such as type 2 diabetes and high blood pressure. Heart rate and blood pressure. Body temperature. Skin for abnormal spots. What immunizations do I need?  Vaccines are usually given at various ages, according to a schedule. Your health care provider will recommend vaccines for you based on your age, medical history, and lifestyle or other factors, such as travel or where you work. What tests do I need? Screening Your health care provider may recommend screening tests for certain conditions. This may include: Lipid and cholesterol levels. Hepatitis C test. Hepatitis B test. HIV (human immunodeficiency virus) test. STI (sexually transmitted infection) testing, if you are at  risk. Lung cancer screening. Colorectal cancer screening. Diabetes screening. This is done by checking your blood sugar (glucose) after you have not eaten for a while (fasting). Mammogram. Talk with your health care provider about how often you should have regular mammograms. BRCA-related cancer screening. This may be done if you have a family history of breast, ovarian, tubal, or peritoneal cancers. Bone density scan. This is done to screen for osteoporosis. Talk with your health care provider about your test results, treatment options, and if necessary, the need for more tests. Follow these instructions at home: Eating and drinking  Eat a diet that includes fresh fruits and vegetables, whole grains, lean protein, and low-fat dairy products. Limit your intake of foods with high amounts of sugar, saturated fats, and salt. Take vitamin and mineral supplements as recommended by your health care provider. Do not drink alcohol if your health care provider tells you not to drink. If you drink alcohol: Limit how much you have to 0-1 drink a day. Know how much alcohol is in your drink. In the [place] one drink equals one 12 oz bottle of beer (355 mL), one 5 oz glass of wine (148 mL), or one 1 oz glass of hard liquor (44 mL). Lifestyle Brush your teeth every morning and night with fluoride toothpaste. Floss one time each day. Exercise for at least 30 minutes 5 or more days each week. Do not use any products that contain nicotine or tobacco. These products include cigarettes, chewing tobacco, and vaping devices, such as e-cigarettes. If you need help quitting, ask your health care provider. Do not use drugs. If you are sexually active, practice safe sex. Use a condom or other form of protection in order to prevent STIs. Take aspirin only as told by   your health care provider. Make sure that you understand how much to take and what form to take. Work with your health care provider to find out whether it  is safe and beneficial for you to take aspirin daily. Ask your health care provider if you need to take a cholesterol-lowering medicine (statin). Find healthy ways to manage stress, such as: Meditation, yoga, or listening to music. Journaling. Talking to a trusted person. Spending time with friends and family. Minimize exposure to UV radiation to reduce your risk of skin cancer. Safety Always wear your seat belt while driving or riding in a vehicle. Do not drive: If you have been drinking alcohol. Do not ride with someone who has been drinking. When you are tired or distracted. While texting. If you have been using any mind-altering substances or drugs. Wear a helmet and other protective equipment during sports activities. If you have firearms in your house, make sure you follow all gun safety procedures. What's next? Visit your health care provider once a year for an annual wellness visit. Ask your health care provider how often you should have your eyes and teeth checked. Stay up to date on all vaccines. This information is not intended to replace advice given to you by your health care provider. Make sure you discuss any questions you have with your health care provider. Document Revised: 09/13/2020 Document Reviewed: 09/13/2020 Elsevier Patient Education  2023 Elsevier Inc.  

## 2021-10-17 LAB — COMPREHENSIVE METABOLIC PANEL
ALT: 12 IU/L (ref 0–32)
AST: 18 IU/L (ref 0–40)
Albumin/Globulin Ratio: 1.5 (ref 1.2–2.2)
Albumin: 3.9 g/dL (ref 3.8–4.8)
Alkaline Phosphatase: 65 IU/L (ref 44–121)
BUN/Creatinine Ratio: 19 (ref 12–28)
BUN: 15 mg/dL (ref 8–27)
Bilirubin Total: 0.6 mg/dL (ref 0.0–1.2)
CO2: 25 mmol/L (ref 20–29)
Calcium: 9.4 mg/dL (ref 8.7–10.3)
Chloride: 103 mmol/L (ref 96–106)
Creatinine, Ser: 0.79 mg/dL (ref 0.57–1.00)
Globulin, Total: 2.6 g/dL (ref 1.5–4.5)
Glucose: 81 mg/dL (ref 70–99)
Potassium: 4.8 mmol/L (ref 3.5–5.2)
Sodium: 141 mmol/L (ref 134–144)
Total Protein: 6.5 g/dL (ref 6.0–8.5)
eGFR: 80 mL/min/{1.73_m2} (ref >59)

## 2021-10-17 LAB — T3: T3, Total: 99 ng/dL (ref 71–180)

## 2021-10-17 LAB — CBC
Hematocrit: 43.5 % (ref 34.0–46.6)
Hemoglobin: 14.4 g/dL (ref 11.1–15.9)
MCH: 30.7 pg (ref 26.6–33.0)
MCHC: 33.1 g/dL (ref 31.5–35.7)
MCV: 93 fL (ref 79–97)
Platelets: 250 10*3/uL (ref 150–450)
RBC: 4.69 x10E6/uL (ref 3.77–5.28)
RDW: 12.2 % (ref 11.7–15.4)
WBC: 6.1 10*3/uL (ref 3.4–10.8)

## 2021-10-17 LAB — LIPID PANEL
Chol/HDL Ratio: 3.6 ratio (ref 0.0–4.4)
Cholesterol, Total: 182 mg/dL (ref 100–199)
HDL: 51 mg/dL (ref >39)
LDL Chol Calc (NIH): 111 mg/dL — ABNORMAL HIGH (ref 0–99)
Triglycerides: 110 mg/dL (ref 0–149)
VLDL Cholesterol Cal: 20 mg/dL (ref 5–40)

## 2021-10-17 LAB — HEMOGLOBIN A1C
Est. average glucose Bld gHb Est-mCnc: 105 mg/dL
Hgb A1c MFr Bld: 5.3 % (ref 4.8–5.6)

## 2021-10-17 LAB — TSH: TSH: 0.475 u[IU]/mL (ref 0.450–4.500)

## 2021-10-17 LAB — T4, FREE: Free T4: 1.6 ng/dL (ref 0.82–1.77)

## 2022-01-10 ENCOUNTER — Other Ambulatory Visit: Payer: Self-pay | Admitting: Physician Assistant

## 2022-01-10 DIAGNOSIS — E890 Postprocedural hypothyroidism: Secondary | ICD-10-CM

## 2022-03-27 LAB — HM MAMMOGRAPHY

## 2022-03-28 ENCOUNTER — Encounter: Payer: Self-pay | Admitting: Nurse Practitioner

## 2022-04-16 ENCOUNTER — Other Ambulatory Visit: Payer: Self-pay

## 2022-04-16 DIAGNOSIS — E890 Postprocedural hypothyroidism: Secondary | ICD-10-CM

## 2022-04-16 MED ORDER — LEVOTHYROXINE SODIUM 125 MCG PO TABS: 125.0000 ug | ORAL_TABLET | Freq: Every morning | ORAL | 0 refills | Status: DC

## 2022-07-14 ENCOUNTER — Other Ambulatory Visit: Payer: Self-pay | Admitting: Nurse Practitioner

## 2022-07-14 DIAGNOSIS — E890 Postprocedural hypothyroidism: Secondary | ICD-10-CM

## 2022-07-16 ENCOUNTER — Other Ambulatory Visit: Payer: Self-pay | Admitting: Nurse Practitioner

## 2022-07-16 DIAGNOSIS — E890 Postprocedural hypothyroidism: Secondary | ICD-10-CM

## 2022-07-18 ENCOUNTER — Encounter: Payer: Self-pay | Admitting: Family Medicine

## 2022-07-18 ENCOUNTER — Ambulatory Visit (INDEPENDENT_AMBULATORY_CARE_PROVIDER_SITE_OTHER): Payer: Medicare HMO | Admitting: Family Medicine

## 2022-07-18 VITALS — BP 133/83 | HR 67 | Resp 18 | Ht 68.0 in | Wt 162.0 lb

## 2022-07-18 DIAGNOSIS — G629 Polyneuropathy, unspecified: Secondary | ICD-10-CM

## 2022-07-18 DIAGNOSIS — E559 Vitamin D deficiency, unspecified: Secondary | ICD-10-CM | POA: Diagnosis not present

## 2022-07-18 DIAGNOSIS — E890 Postprocedural hypothyroidism: Secondary | ICD-10-CM | POA: Diagnosis not present

## 2022-07-18 DIAGNOSIS — E538 Deficiency of other specified B group vitamins: Secondary | ICD-10-CM | POA: Diagnosis not present

## 2022-07-18 MED ORDER — LEVOTHYROXINE SODIUM 125 MCG PO TABS
125.0000 ug | ORAL_TABLET | Freq: Every morning | ORAL | 0 refills | Status: DC
Start: 2022-07-18 — End: 2022-10-14

## 2022-07-18 NOTE — Assessment & Plan Note (Signed)
Currently taking vitamin D3 2000 units daily, repeating vitamin D levels today.

## 2022-07-18 NOTE — Progress Notes (Signed)
Established Patient Office Visit  Subjective   Patient ID: Kelsey Romero, female    DOB: 1950/10/24  Age: 72 y.o. MRN: [id number]  Chief Complaint  Patient presents with   Hypothyroidism    HPI Kelsey Romero is a 72 y.o. female presenting today for follow up of hypothyroidism.  She also has some concerns regarding burning and numbness bilaterally on the soles of her feet that has been happening for several months now.  It has been fairly stable since it started.  It gets worse when she is walking, but otherwise she has not noticed any specific alleviating or worsening factors. Hypothyroidism: Taking levothyroxine 125 mcg regularly in the AM away from food and vitamins. Denies fatigue, weight changes, heat/cold intolerance, skin/hair changes, bowel changes, CVS symptoms.  ROS Negative unless otherwise noted in HPI   Objective:     BP 133/83 (BP Location: Left Arm, Patient Position: Sitting, Cuff Size: Normal)   Pulse 67   Resp 18   Ht  (1.727 m)   Wt 162 lb (73.5 kg)   SpO2 98%   BMI 24.63 kg/m   Physical Exam Constitutional:      General: She is not in acute distress.    Appearance: Normal appearance.  HENT:     Head: Normocephalic and atraumatic.  Cardiovascular:     Rate and Rhythm: Normal rate and regular rhythm.     Heart sounds: No murmur heard.    No friction rub. No gallop.  Pulmonary:     Effort: Pulmonary effort is normal. No respiratory distress.     Breath sounds: No wheezing, rhonchi or rales.  Musculoskeletal:        General: No swelling or deformity. Normal range of motion.     Right foot: Normal range of motion. No swelling or foot drop.     Left foot: Normal range of motion. No swelling or foot drop.  Skin:    General: Skin is warm and dry.  Neurological:     Mental Status: She is alert and oriented to person, place, and time.     Assessment & Plan:  Postoperative hypothyroidism Assessment & Plan: Last TSH on 10/16/2021 within normal  limits.  Repeating thyroid function labs today, continue levothyroxine 125 mcg daily unless lab results warrant change.  Orders: -     TSH; Future -     T4, free; Future -     T3, free; Future -     VITAMIN D 25 Hydroxy (Vit-D Deficiency, Fractures); Future -     Levothyroxine Sodium; Take 1 tablet (125 mcg total) by mouth every morning.  Dispense: 90 tablet; Refill: 0  Vitamin D deficiency Assessment & Plan: Currently taking vitamin D3 2000 units daily, repeating vitamin D levels today.  Orders: -     VITAMIN D 25 Hydroxy (Vit-D Deficiency, Fractures); Future  Vitamin B12 deficiency -     Vitamin B12; Future  Peripheral polyneuropathy  Foot neuropathy Discussed referral to neurology for nerve conduction studies, patient declines referral at this time.  Provided rehabilitation exercises for her to try at home for tarsal tunnel syndrome as it sounds like her symptoms of burning and numbness on the sole of her foot are most likely secondary to compression of the posterior tibial nerve.  She is aware that she can give [place] a call or send a message if she does decide that she would like a referral to neurology in the future.  Return in about 6 months (  around 01/17/2023) for Medicare annual wellness visit, fasting blood work 1 week before.    Melida Quitter, PA

## 2022-07-18 NOTE — Assessment & Plan Note (Addendum)
Last TSH on 10/16/2021 within normal limits.  Repeating thyroid function labs today, continue levothyroxine 125 mcg daily unless lab results warrant change.

## 2022-07-18 NOTE — Patient Instructions (Signed)
I would recommend seeing urology for definitive cause of the burning and numbness on your foot.  It sounds like it is most likely something like tarsal tunnel syndrome where there is some compression on one of your nerves.  I have included some exercises to try at home that may be helpful.  If they do not provide attentive relief, please let me know and I will send in a referral to neurology.

## 2022-07-19 LAB — VITAMIN B12: Vitamin B-12: 430 pg/mL (ref 232–1245)

## 2022-07-19 LAB — T4, FREE: Free T4: 1.5 ng/dL (ref 0.82–1.77)

## 2022-07-19 LAB — TSH: TSH: 1.24 u[IU]/mL (ref 0.450–4.500)

## 2022-07-19 LAB — VITAMIN D 25 HYDROXY (VIT D DEFICIENCY, FRACTURES): Vit D, 25-Hydroxy: 50.1 ng/mL (ref 30.0–100.0)

## 2022-07-19 LAB — T3, FREE: T3, Free: 2.4 pg/mL (ref 2.0–4.4)

## 2022-10-12 ENCOUNTER — Other Ambulatory Visit: Payer: Self-pay | Admitting: Family Medicine

## 2022-10-12 DIAGNOSIS — E890 Postprocedural hypothyroidism: Secondary | ICD-10-CM

## 2022-10-14 ENCOUNTER — Other Ambulatory Visit: Payer: Self-pay | Admitting: Family Medicine

## 2022-10-14 DIAGNOSIS — E890 Postprocedural hypothyroidism: Secondary | ICD-10-CM

## 2023-01-06 ENCOUNTER — Other Ambulatory Visit: Payer: Self-pay | Admitting: Family Medicine

## 2023-01-06 DIAGNOSIS — E890 Postprocedural hypothyroidism: Secondary | ICD-10-CM

## 2023-01-20 ENCOUNTER — Ambulatory Visit: Payer: Medicare HMO | Admitting: Family Medicine

## 2023-04-03 LAB — HM MAMMOGRAPHY

## 2023-04-04 ENCOUNTER — Encounter: Payer: Self-pay | Admitting: Family Medicine

## 2023-04-07 ENCOUNTER — Other Ambulatory Visit: Payer: Self-pay | Admitting: Family Medicine

## 2023-04-07 DIAGNOSIS — E890 Postprocedural hypothyroidism: Secondary | ICD-10-CM

## 2023-04-17 ENCOUNTER — Ambulatory Visit (INDEPENDENT_AMBULATORY_CARE_PROVIDER_SITE_OTHER): Payer: Medicare HMO | Admitting: Podiatry

## 2023-04-17 ENCOUNTER — Encounter: Payer: Self-pay | Admitting: Podiatry

## 2023-04-17 DIAGNOSIS — G629 Polyneuropathy, unspecified: Secondary | ICD-10-CM

## 2023-04-17 DIAGNOSIS — D3610 Benign neoplasm of peripheral nerves and autonomic nervous system, unspecified: Secondary | ICD-10-CM | POA: Diagnosis not present

## 2023-04-17 NOTE — Patient Instructions (Signed)
Go ahead and start a B-COMPLEX vitamin and ALPHA-LIPOIC vitamin  --  Neuropathic Pain Neuropathic pain is pain caused by damage to the nerves that are responsible for certain sensations in your body (sensory nerves). Neuropathic pain can make you more sensitive to pain. Even a minor sensation can feel very painful. This is usually a long-term (chronic) condition that can be difficult to treat. The type of pain differs from person to person. It may: Start suddenly (acute), or it may develop slowly and become chronic. Come and go as damaged nerves heal, or it may stay at the same level for years. Cause emotional distress, loss of sleep, and a lower quality of life. What are the causes? The most common cause of this condition is diabetes. Many other diseases and conditions can also cause neuropathic pain. Causes of neuropathic pain can be classified as: Toxic. This is caused by medicines and chemicals. The most common causes of toxic neuropathic pain is damage from medicines that kill cancer cells (chemotherapy) or alcohol abuse. Metabolic. This can be caused by: Diabetes. Lack of vitamins like B12. Traumatic. Any injury that cuts, crushes, or stretches a nerve can cause damage and pain. Compression-related. If a sensory nerve gets trapped or compressed for a long period of time, the blood supply to the nerve can be cut off. Vascular. Many blood vessel diseases can cause neuropathic pain by decreasing blood supply and oxygen to nerves. Autoimmune. This type of pain results from diseases in which the body's defense system (immune system) mistakenly attacks sensory nerves. Examples of autoimmune diseases that can cause neuropathic pain include lupus and multiple sclerosis. Infectious. Many types of viral infections can damage sensory nerves and cause pain. Shingles infection is a common cause of this type of pain. Inherited. Neuropathic pain can be a symptom of many diseases that are passed down  through families (genetic). What increases the risk? You are more likely to develop this condition if: You have diabetes. You smoke. You drink too much alcohol. You are taking certain medicines, including chemotherapy or medicines that treat immune system disorders. What are the signs or symptoms? The main symptom is pain. Neuropathic pain is often described as: Burning. Shock-like. Stinging. Hot or cold. Itching. How is this diagnosed? No single test can diagnose neuropathic pain. It is diagnosed based on: A physical exam and your symptoms. Your health care provider will ask you about your pain. You may be asked to use a pain scale to describe how bad your pain is. Tests. These may be done to see if you have a cause and location of any nerve damage. They include: Nerve conduction studies and electromyography to test how well nerve signals travel through your nerves and muscles (electrodiagnostic testing). Skin biopsy to evaluate for small fiber neuropathy. Imaging studies, such as: X-rays. CT scan. MRI. How is this treated? Treatment for neuropathic pain may change over time. You may need to try different treatment options or a combination of treatments. Some options include: Treating the underlying cause of the neuropathy, such as diabetes, kidney disease, or vitamin deficiencies. Stopping medicines that can cause neuropathy, such as chemotherapy. Medicine to relieve pain. Medicines may include: Prescription or over-the-counter pain medicine. Anti-seizure medicine. Antidepressant medicines. Pain-relieving patches or creams that are applied to painful areas of skin. A medicine to numb the area (local anesthetic), which can be injected as a nerve block. Transcutaneous nerve stimulation. This uses electrical currents to block painful nerve signals. The treatment is painless. Alternative treatments, such  as: Acupuncture. Meditation. Massage. Occupational or physical  therapy. Pain management programs. Counseling. Follow these instructions at home: Medicines  Take over-the-counter and prescription medicines only as told by your health care provider. Ask your health care provider if the medicine prescribed to you: Requires you to avoid driving or using machinery. Can cause constipation. You may need to take these actions to prevent or treat constipation: Drink enough fluid to keep your urine pale yellow. Take over-the-counter or prescription medicines. Eat foods that are high in fiber, such as beans, whole grains, and fresh fruits and vegetables. Limit foods that are high in fat and processed sugars, such as fried or sweet foods. Lifestyle  Have a good support system at home. Consider joining a chronic pain support group. Do not use any products that contain nicotine or tobacco. These products include cigarettes, chewing tobacco, and vaping devices, such as e-cigarettes. If you need help quitting, ask your health care provider. Do not drink alcohol. General instructions Learn as much as you can about your condition. Work closely with all your health care providers to find the treatment plan that works best for you. Ask your health care provider what activities are safe for you. Keep all follow-up visits. This is important. Contact a health care provider if: Your pain treatments are not working. You are having side effects from your medicines. You are struggling with tiredness (fatigue), mood changes, depression, or anxiety. Get help right away if: You have thoughts of hurting yourself. Get help right away if you feel like you may hurt yourself or others, or have thoughts about taking your own life. Go to your nearest emergency room or: Call 911. Call the National Suicide Prevention Lifeline at 717-283-3248 or 988. This is open 24 hours a day. Text the Crisis Text Line at 6464836341. Summary Neuropathic pain is pain caused by damage to the nerves  that are responsible for certain sensations in your body (sensory nerves). Neuropathic pain may come and go as damaged nerves heal, or it may stay at the same level for years. Neuropathic pain is usually a long-term condition that can be difficult to treat. Consider joining a chronic pain support group. This information is not intended to replace advice given to you by your health care provider. Make sure you discuss any questions you have with your health care provider. Document Revised: 11/13/2020 Document Reviewed: 11/13/2020 Elsevier Patient Education  2024 [employer].

## 2023-04-17 NOTE — Progress Notes (Signed)
  Subjective:  Patient ID: Kelsey Romero, female    DOB: 02-Nov-1950,  MRN: [id number]  Chief Complaint  Patient presents with   Foot Pain    RM#11 Patient states having burning,tingling and numbness in both feet for the past several months.     Discussed the use of AI scribe software for clinical note transcription with the patient, who gave verbal consent to proceed.  History of Present Illness           73 y.o. female with no history of diabetes or alcohol consumption, presents with a couple of months history of numbness and burning sensation in the feet. The symptoms were first noticed a [place] ago [place] the beach when the patient's feet turned solid white, resembling a blister. The symptoms have been consistent and daily, with the most discomfort in the ball of the foot. The patient also reports having trouble with the left leg, but it is unclear if this is related to the numbness and burning sensation in the feet. The patient denies having back pain. The patient has not received any treatment for these symptoms so far.   Objective:    Physical Exam         General: AAO x3, NAD  Dermatological: Skin is warm, dry and supple bilateral.  There are no open sores, no preulcerative lesions, no rash or signs of infection present.  Vascular: Dorsalis Pedis artery and Posterior Tibial artery pedal pulses are 2/4 bilateral with immedate capillary fill time.  There is no pain with calf compression, swelling, warmth, erythema.   Neruologic: Sensation intact with Semmes Weinstein monofilament bilaterally.  There is decreased vibratory sensation on the right side compared to left side.  Musculoskeletal: Unable to appreciate any area pinpoint tenderness.  There is a small palpable neuroma, bursa present on the second interspaces.  She has some discomfort in the right second interspace but no areas of pinpoint tenderness.  Muscular strength 5/5 in all groups tested bilateral.  Gait:  Unassisted, Nonantalgic.    No images are attached to the encounter.    Results          Assessment:   Concern for neuropathy  Plan:  Patient was evaluated and treated and all questions answered.  Assessment and Plan          -Treatment options discussed including all alternatives, risks, and complications -Etiology of symptoms were discussed -Although she does have some tenderness on the right second interspace and should have a small neuroma her symptoms are more diffuse in all the toes bilaterally and this does not fit the same picture is not neuroma.  Peripheral Neuropathy   Chronic bilateral foot numbness and tingling, more pronounced on the right side. No known risk factors such as diabetes or alcohol use. Decreased vibratory sensation on the right foot. No associated back pain or recent injuries.   -Refer to neurology for further evaluation and possible nerve conduction study.   -Start over-the-counter B complex vitamin and alpha lipoic acid for nerve health.   -Consider trial of gabapentin if symptoms worsen or she changes mind.    Possible Neuroma   Localized tenderness and inflammation in the ball of the foot. Symptoms not typical of a neuroma as they involve all toes.   -Provide gel pad for cushioning and pressure relief during walking.     X-ray if symptoms continue but there was no injury or bony tenderness today.    Vivi Barrack DPM

## 2023-05-02 ENCOUNTER — Telehealth: Payer: Self-pay | Admitting: Podiatry

## 2023-05-02 ENCOUNTER — Other Ambulatory Visit: Payer: Self-pay | Admitting: Podiatry

## 2023-05-02 DIAGNOSIS — G629 Polyneuropathy, unspecified: Secondary | ICD-10-CM

## 2023-05-02 NOTE — Telephone Encounter (Signed)
Pt called and she cal;led and  talked with [facility] about the referral for neuropathy in her feet. She never heard from them. She said they told her that since she was seen the day before by the hand specialist she would need to see them for the neuropathy in her feet. She stated she called the hand center and they told her they do treat neuropathy in the feet. She is needing a referral sent to them. She would like [place] to confirm that the treat feet as well since they are the hand center.

## 2023-05-02 NOTE — Telephone Encounter (Signed)
Patient goes to [facility] [place] and yes they do treat neuropathy of the feet.

## 2023-05-05 NOTE — Telephone Encounter (Signed)
Notified pt referral was sent. She said thank you

## 2023-05-08 ENCOUNTER — Encounter: Payer: Self-pay | Admitting: Family Medicine

## 2023-07-08 ENCOUNTER — Other Ambulatory Visit: Payer: Self-pay | Admitting: Family Medicine

## 2023-07-08 DIAGNOSIS — E890 Postprocedural hypothyroidism: Secondary | ICD-10-CM

## 2023-07-09 NOTE — Telephone Encounter (Signed)
 LVM for pt to call office to see about getting an appointment scheduled. Last appt was 07/2022 will send in a 30 day supply with appt scheduled and send a note to pharmacy that she needs an appt for further refills.

## 2023-07-10 ENCOUNTER — Telehealth: Payer: Self-pay | Admitting: Family Medicine

## 2023-07-10 DIAGNOSIS — E890 Postprocedural hypothyroidism: Secondary | ICD-10-CM

## 2023-07-10 NOTE — Telephone Encounter (Signed)
 Spoke with pt and an apt has been scheduled.

## 2023-07-10 NOTE — Telephone Encounter (Signed)
 Duplicate request, cancelled reorder. Please see 07/08/23 refill request. Patient needed to be scheduled for an appointment and then clinic will send a 30 day suppl, however no available appts with Lequita Halt and next available for Dr Constance Goltz not until June. Routing to clinic.

## 2023-07-10 NOTE — Telephone Encounter (Signed)
 Copied from CRM [id number]. Topic: Clinical - Medication Refill >> Jul 10, 2023  8:50 AM Marissa P wrote: Most Recent Primary Care Visit:  Provider: Saralyn Pilar A  Department: [facility]  Visit Type: FOLLOW UP 20  Date: 07/18/2022  Medication: levothyroxine (SYNTHROID) 125 MCG tablet  Has the patient contacted their pharmacy? Yes (Agent: If no, request that the patient contact the pharmacy for the refill. If patient does not wish to contact the pharmacy document the reason why and proceed with request.) (Agent: If yes, when and what did the pharmacy advise?)  Is this the correct pharmacy for this prescription? Yes If no, delete pharmacy and type the correct one.  This is the patient's preferred pharmacy:  [facility] [address] ([address] [place] - [address] [place] ([place]) [place] [address] Phone: [phone] Fax: [phone] Hours: Not open 24 hours Phone: [phone] Fax: [phone]  The [facility] is the ONLY pharmacy she would like on her file and is her preferred pharmacy please.    Has the prescription been filled recently? Yes  Is the patient out of the medication? Yes  Has the patient been seen for an appointment in the last year OR does the patient have an upcoming appointment? Yes  Can we respond through MyChart? Yes  Agent: Please be advised that Rx refills may take up to 3 business days. We ask that you follow-up with your pharmacy.

## 2023-07-15 ENCOUNTER — Ambulatory Visit: Payer: Medicare HMO | Admitting: Podiatry

## 2023-07-17 ENCOUNTER — Ambulatory Visit: Payer: Medicare HMO

## 2023-07-23 ENCOUNTER — Ambulatory Visit: Admitting: Family Medicine

## 2023-07-23 ENCOUNTER — Encounter: Payer: Self-pay | Admitting: Family Medicine

## 2023-07-23 VITALS — BP 123/74 | HR 68 | Ht 68.0 in | Wt 166.1 lb

## 2023-07-23 DIAGNOSIS — E782 Mixed hyperlipidemia: Secondary | ICD-10-CM | POA: Diagnosis not present

## 2023-07-23 DIAGNOSIS — E559 Vitamin D deficiency, unspecified: Secondary | ICD-10-CM | POA: Diagnosis not present

## 2023-07-23 DIAGNOSIS — R143 Flatulence: Secondary | ICD-10-CM | POA: Diagnosis not present

## 2023-07-23 DIAGNOSIS — E890 Postprocedural hypothyroidism: Secondary | ICD-10-CM | POA: Diagnosis not present

## 2023-07-23 MED ORDER — DEXLANSOPRAZOLE 60 MG PO CPDR: DELAYED_RELEASE_CAPSULE | ORAL | 1 refills | Status: DC

## 2023-07-23 NOTE — Progress Notes (Signed)
   Established Patient Office Visit  Subjective   Patient ID: Kelsey Romero, female    DOB: 1950-07-09  Age: 73 y.o. MRN: [id number]  Chief Complaint  Patient presents with   Medical Management of Chronic Issues    HPI  Subjective: - Medication review: levothyroxine , vitamin D  - Request for Dexilant  prescription for reflux - Reports burping, occasional regurgitation - Uses OTC liquid antacid with some relief - Back rubs help with burping - Sometimes sits up until 1-2 AM with pillows due to inability to lay down - Symptoms not constant but has had issues past week - Previous endoscopy performed - no findings - Possible triggers: eating too much at one time, spicy foods  Past Medical History: - Hypothyroidism on levothyroxine  - Vitamin D  deficiency - GERD - Mildly elevated cholesterol  The 10-year ASCVD risk score (Arnett DK, et al., 2019) is: 10.8%  Health Maintenance Due  Topic Date Due   Hepatitis C Screening  Never done   DTaP/Tdap/Td (1 - Tdap) Never done   Pneumonia Vaccine 96+ Years old (1 of 1 - PCV) Never done   Zoster Vaccines- Shingrix (1 of 2) 09/19/2000   DEXA SCAN  Never done   Medicare Annual Wellness (AWV)  10/17/2022   COVID-19 Vaccine (4 - 2024-25 season) 12/01/2022      Objective:     BP 123/74   Pulse 68   Ht 5\' 8"  (1.727 m)   Wt 166 lb 1.9 oz (75.4 kg)   BMI 25.26 kg/m    Physical Exam General: Alert, oriented CV: Regular rhythm Pulmonary: Lungs clear bilaterally Psych: Pleasant affect   No results found for any visits on 07/23/23.      Assessment & Plan:   Postoperative hypothyroidism Assessment & Plan: Currently taking 125 mcg levothyroxine .  Getting TSH today, last 1 was a year ago.  No issues with medications.  Discussed on how to take levothyroxine  properly.  Orders: -     TSH + free T4 -     Comprehensive metabolic panel with GFR  Vitamin D  deficiency Assessment & Plan: Takes daily over-the-counter vitamin D   supplement.  Checking today.  Orders: -     VITAMIN D  25 Hydroxy (Vit-D Deficiency, Fractures)  Moderate mixed hyperlipidemia not requiring statin therapy -     Lipid panel  Intestinal gas excretion Assessment & Plan: Patient complaining of reflux symptoms, gassiness.  Requested refill of Dexilant  60 mg that was prescribed several years ago and helped when other PPIs did not help.  Discussed lifestyle modifications.  Sending in prescription for daily 60 mg for for 1 month then every other day for 2 weeks then twice a week for 2 weeks before stopping.   Other orders -     Dexlansoprazole ; Take 1 capsule daily for one month then every other day for 2 weeks.  Dispense: 30 capsule; Refill: 1     Return in about 1 year (around 07/22/2024) for physical.    Laneta Pintos, MD

## 2023-07-23 NOTE — Assessment & Plan Note (Signed)
 Takes daily over-the-counter vitamin D  supplement.  Checking today.

## 2023-07-23 NOTE — Assessment & Plan Note (Signed)
 Currently taking 125 mcg levothyroxine .  Getting TSH today, last 1 was a year ago.  No issues with medications.  Discussed on how to take levothyroxine  properly.

## 2023-07-23 NOTE — Assessment & Plan Note (Signed)
 Patient complaining of reflux symptoms, gassiness.  Requested refill of Dexilant  60 mg that was prescribed several years ago and helped when other PPIs did not help.  Discussed lifestyle modifications.  Sending in prescription for daily 60 mg for for 1 month then every other day for 2 weeks then twice a week for 2 weeks before stopping.

## 2023-07-23 NOTE — Patient Instructions (Addendum)
 It was nice to see you today,  We addressed the following topics today: - I am sending in your dexilant .  Take it daily for one month, then every other day for two weeks, then twice a week for two weeks before stopping.   - I am ordering some labs and will let you know the results when I get them.    Have a great day,  Etha Henle, MD

## 2023-07-24 ENCOUNTER — Other Ambulatory Visit: Payer: Self-pay | Admitting: Family Medicine

## 2023-07-24 ENCOUNTER — Encounter: Payer: Self-pay | Admitting: Family Medicine

## 2023-07-24 DIAGNOSIS — E890 Postprocedural hypothyroidism: Secondary | ICD-10-CM

## 2023-07-24 LAB — COMPREHENSIVE METABOLIC PANEL WITH GFR
ALT: 13 IU/L (ref 0–32)
AST: 18 IU/L (ref 0–40)
Albumin: 4.3 g/dL (ref 3.8–4.8)
Alkaline Phosphatase: 70 IU/L (ref 44–121)
BUN/Creatinine Ratio: 15 (ref 12–28)
BUN: 13 mg/dL (ref 8–27)
Bilirubin Total: 0.5 mg/dL (ref 0.0–1.2)
CO2: 23 mmol/L (ref 20–29)
Calcium: 9.5 mg/dL (ref 8.7–10.3)
Chloride: 104 mmol/L (ref 96–106)
Creatinine, Ser: 0.87 mg/dL (ref 0.57–1.00)
Globulin, Total: 2.5 g/dL (ref 1.5–4.5)
Glucose: 84 mg/dL (ref 70–99)
Potassium: 4.6 mmol/L (ref 3.5–5.2)
Sodium: 141 mmol/L (ref 134–144)
Total Protein: 6.8 g/dL (ref 6.0–8.5)
eGFR: 71 mL/min/{1.73_m2} (ref >59)

## 2023-07-24 LAB — TSH+FREE T4
Free T4: 2.01 ng/dL — ABNORMAL HIGH (ref 0.82–1.77)
TSH: 0.314 u[IU]/mL — ABNORMAL LOW (ref 0.450–4.500)

## 2023-07-24 LAB — LIPID PANEL
Chol/HDL Ratio: 4.1 ratio (ref 0.0–4.4)
Cholesterol, Total: 201 mg/dL — ABNORMAL HIGH (ref 100–199)
HDL: 49 mg/dL (ref >39)
LDL Chol Calc (NIH): 132 mg/dL — ABNORMAL HIGH (ref 0–99)
Triglycerides: 110 mg/dL (ref 0–149)
VLDL Cholesterol Cal: 20 mg/dL (ref 5–40)

## 2023-07-24 LAB — VITAMIN D 25 HYDROXY (VIT D DEFICIENCY, FRACTURES): Vit D, 25-Hydroxy: 47.3 ng/mL (ref 30.0–100.0)

## 2023-07-24 MED ORDER — LEVOTHYROXINE SODIUM 112 MCG PO TABS: 112.0000 ug | ORAL_TABLET | Freq: Every morning | ORAL | 1 refills | Status: DC

## 2023-07-24 NOTE — Telephone Encounter (Signed)
 Spoke with pt. Pt is ok with getting the lower dose of medication. Preferred Pharmacy is [facility] [address].   Lab appt scheduled for 09/16/23.

## 2023-07-24 NOTE — Addendum Note (Signed)
 Addended by: Laneta Pintos on: 07/24/2023 05:31 PM   Modules accepted: Orders

## 2023-07-25 ENCOUNTER — Other Ambulatory Visit: Payer: Self-pay | Admitting: *Deleted

## 2023-07-25 ENCOUNTER — Telehealth: Payer: Self-pay | Admitting: *Deleted

## 2023-07-25 MED ORDER — DEXLANSOPRAZOLE 60 MG PO CPDR: DELAYED_RELEASE_CAPSULE | ORAL | 1 refills | Status: AC

## 2023-07-25 MED ORDER — DEXLANSOPRAZOLE 60 MG PO CPDR: DELAYED_RELEASE_CAPSULE | ORAL | 1 refills | Status: DC

## 2023-07-25 NOTE — Telephone Encounter (Signed)
 I checked and the [facility]othy[facility]  [facility] yesterday and then sent the other to the pharmacy today.  Will call to verify if PA is required.

## 2023-07-25 NOTE — Telephone Encounter (Signed)
 Copied from CRM [id number]. Topic: Clinical - Prescription Issue >> Jul 25, 2023  8:42 AM Zipporah Him wrote: Reason for CRM: Patient needs a prior authorization sent over her medication dexlansoprazole  (DEXILANT ) 60 MG capsule, she wants to ensure this is sent to the [facility] on [address]. She provided a number for the prior authorization [id number]. She also noted her levothyroxine  was sent [facility] to [facility] but it was supposed to go to the same [place] pharmacy so she wants to ensure that one is going to [place] as well.  Please reach out if there any problems/questions. Patient plans to call back before noon to follow up on progress.

## 2023-09-12 ENCOUNTER — Other Ambulatory Visit: Payer: Self-pay | Admitting: *Deleted

## 2023-09-12 DIAGNOSIS — E890 Postprocedural hypothyroidism: Secondary | ICD-10-CM

## 2023-09-15 ENCOUNTER — Telehealth: Payer: Self-pay | Admitting: Family Medicine

## 2023-09-15 NOTE — Telephone Encounter (Signed)
 Pt will be coming as early as 8am tomorrow

## 2023-09-15 NOTE — Telephone Encounter (Unsigned)
 Copied from CRM [id number]. Topic: General - Other >> Sep 15, 2023  1:56 PM Tiffini S wrote: Reason for CRM: Patient called asking appointment for 09/16/23 for recheck labs. Patient wants to know if she can come in around 8:15 or 8:30am for the appointment.

## 2023-09-16 ENCOUNTER — Other Ambulatory Visit

## 2023-09-16 DIAGNOSIS — E890 Postprocedural hypothyroidism: Secondary | ICD-10-CM

## 2023-09-17 ENCOUNTER — Ambulatory Visit: Payer: Self-pay | Admitting: Family Medicine

## 2023-09-17 LAB — TSH+FREE T4
Free T4: 1.75 ng/dL (ref 0.82–1.77)
TSH: 0.981 u[IU]/mL (ref 0.450–4.500)

## 2024-01-17 LAB — COLOGUARD: COLOGUARD: NEGATIVE

## 2024-01-21 ENCOUNTER — Other Ambulatory Visit: Payer: Self-pay | Admitting: Family Medicine

## 2024-01-21 DIAGNOSIS — E890 Postprocedural hypothyroidism: Secondary | ICD-10-CM

## 2024-01-26 ENCOUNTER — Telehealth: Payer: Self-pay

## 2024-01-26 ENCOUNTER — Telehealth: Payer: Self-pay | Admitting: Family Medicine

## 2024-01-26 NOTE — Telephone Encounter (Unsigned)
 Copied from CRM [id number]. Topic: Clinical - Medication Refill >> Jan 26, 2024  9:53 AM Amy B wrote: Medication: levothyroxine  (SYNTHROID ) 112 MCG tablet  Has the patient contacted their pharmacy? Yes (Agent: If no, request that the patient contact the pharmacy for the refill. If patient does not wish to contact the pharmacy document the reason why and proceed with request.) (Agent: If yes, when and what did the pharmacy advise?)  This is the patient's preferred pharmacy:  [facility] [address] ([address]), [place] - [address] [place] ([place]) [place] [address] Phone: [phone] Fax: [phone]  Is this the correct pharmacy for this prescription? Yes If no, delete pharmacy and type the correct one.   Has the prescription been filled recently? No  Is the patient out of the medication? Yes  Has the patient been seen for an appointment in the last year OR does the patient have an upcoming appointment? Yes  Can we respond through MyChart? Yes  Agent: Please be advised that Rx refills may take up to 3 business days. We ask that you follow-up with your pharmacy.

## 2024-01-26 NOTE — Telephone Encounter (Signed)
 Copied from CRM [id number]. Topic: Clinical - Medical Advice >> Jan 26, 2024  9:57 AM Amy B wrote: Reason for CRM: Patient requests a call back to discuss why she needs to be calling the clinic, having blood work drawn, and making appointments every time she needs to have this medication refilled.  She would like someone to call her to explain this to her [phone]    Called pt she is advised that she has 2 refills left until her next lab appt she stated [facility] telling [facility] that she will have to contact the office every time she needs a refill for her Levothyroxine 

## 2024-01-26 NOTE — Telephone Encounter (Signed)
 I dont know what she's talking about.  She has been seen once a year for the past three years and at most has had two lab visits each year.  The times she had more than one lab in a year was when we were adjusting her dose.  The most recent prescription was 90 days with two refills.

## 2024-04-09 LAB — HM MAMMOGRAPHY

## 2024-07-19 ENCOUNTER — Other Ambulatory Visit

## 2024-07-26 ENCOUNTER — Encounter: Admitting: Family Medicine
